# Patient Record
Sex: Female | Born: 1970 | Race: White | Hispanic: No | Marital: Single | Smoking: Current every day smoker
Health system: Southern US, Community
[De-identification: ages and names within clinical notes are randomized; demographics above are authoritative.]

## PROBLEM LIST (undated history)

## (undated) ENCOUNTER — Emergency Department (HOSPITAL_COMMUNITY): Admission: EM | Discharge status: Against Medical Advice | Payer: Self-pay

## (undated) DIAGNOSIS — M5430 Sciatica, unspecified side: Secondary | ICD-10-CM

## (undated) DIAGNOSIS — F419 Anxiety disorder, unspecified: Secondary | ICD-10-CM

## (undated) DIAGNOSIS — F1910 Other psychoactive substance abuse, uncomplicated: Secondary | ICD-10-CM

## (undated) DIAGNOSIS — F319 Bipolar disorder, unspecified: Secondary | ICD-10-CM

## (undated) DIAGNOSIS — F4310 Post-traumatic stress disorder, unspecified: Secondary | ICD-10-CM

## (undated) DIAGNOSIS — Z8739 Personal history of other diseases of the musculoskeletal system and connective tissue: Secondary | ICD-10-CM

## (undated) DIAGNOSIS — F329 Major depressive disorder, single episode, unspecified: Secondary | ICD-10-CM

## (undated) DIAGNOSIS — K219 Gastro-esophageal reflux disease without esophagitis: Secondary | ICD-10-CM

## (undated) DIAGNOSIS — J45909 Unspecified asthma, uncomplicated: Secondary | ICD-10-CM

## (undated) DIAGNOSIS — H269 Unspecified cataract: Secondary | ICD-10-CM

## (undated) DIAGNOSIS — R1319 Other dysphagia: Secondary | ICD-10-CM

## (undated) DIAGNOSIS — K5792 Diverticulitis of intestine, part unspecified, without perforation or abscess without bleeding: Secondary | ICD-10-CM

## (undated) DIAGNOSIS — T7840XA Allergy, unspecified, initial encounter: Secondary | ICD-10-CM

## (undated) DIAGNOSIS — K279 Peptic ulcer, site unspecified, unspecified as acute or chronic, without hemorrhage or perforation: Secondary | ICD-10-CM

## (undated) DIAGNOSIS — M1990 Unspecified osteoarthritis, unspecified site: Secondary | ICD-10-CM

## (undated) DIAGNOSIS — F603 Borderline personality disorder: Secondary | ICD-10-CM

## (undated) HISTORY — DX: Anxiety disorder, unspecified: F41.9

## (undated) HISTORY — DX: Other dysphagia: R13.19

## (undated) HISTORY — DX: Borderline personality disorder: F60.3

## (undated) HISTORY — DX: Unspecified asthma, uncomplicated: J45.909

## (undated) HISTORY — DX: Allergy, unspecified, initial encounter: T78.40XA

## (undated) HISTORY — DX: Peptic ulcer, site unspecified, unspecified as acute or chronic, without hemorrhage or perforation: K27.9

## (undated) HISTORY — PX: OTHER SURGICAL HISTORY: SHX169

## (undated) HISTORY — PX: UPPER GASTROINTESTINAL ENDOSCOPY: SHX188

## (undated) HISTORY — DX: Other psychoactive substance abuse, uncomplicated: F19.10

## (undated) HISTORY — DX: Unspecified osteoarthritis, unspecified site: M19.90

## (undated) HISTORY — DX: Unspecified cataract: H26.9

## (undated) HISTORY — DX: Post-traumatic stress disorder, unspecified: F43.10

## (undated) HISTORY — DX: Gastro-esophageal reflux disease without esophagitis: K21.9

---
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
## 1998-01-14 ENCOUNTER — Ambulatory Visit (HOSPITAL_COMMUNITY): Admission: RE | Admit: 1998-01-14 | Discharge: 1998-01-14 | Payer: Self-pay | Admitting: *Deleted

## 1998-11-16 ENCOUNTER — Other Ambulatory Visit: Admission: RE | Admit: 1998-11-16 | Discharge: 1998-11-16 | Payer: Self-pay | Admitting: Obstetrics and Gynecology

## 1999-10-22 ENCOUNTER — Encounter: Payer: Self-pay | Admitting: *Deleted

## 1999-10-22 ENCOUNTER — Ambulatory Visit (HOSPITAL_COMMUNITY): Admission: RE | Admit: 1999-10-22 | Discharge: 1999-10-22 | Payer: Self-pay | Admitting: *Deleted

## 2002-09-02 ENCOUNTER — Encounter: Admission: RE | Admit: 2002-09-02 | Discharge: 2002-09-02 | Payer: Self-pay | Admitting: Internal Medicine

## 2002-10-08 ENCOUNTER — Emergency Department (HOSPITAL_COMMUNITY): Admission: EM | Admit: 2002-10-08 | Discharge: 2002-10-08 | Payer: Self-pay | Admitting: Emergency Medicine

## 2003-11-11 ENCOUNTER — Emergency Department (HOSPITAL_COMMUNITY): Admission: EM | Admit: 2003-11-11 | Discharge: 2003-11-11 | Payer: Self-pay | Admitting: Emergency Medicine

## 2003-11-12 ENCOUNTER — Emergency Department (HOSPITAL_COMMUNITY): Admission: EM | Admit: 2003-11-12 | Discharge: 2003-11-12 | Payer: Self-pay | Admitting: Emergency Medicine

## 2004-03-10 ENCOUNTER — Emergency Department (HOSPITAL_COMMUNITY): Admission: EM | Admit: 2004-03-10 | Discharge: 2004-03-10 | Payer: Self-pay | Admitting: Emergency Medicine

## 2005-01-22 ENCOUNTER — Emergency Department (HOSPITAL_COMMUNITY): Admission: EM | Admit: 2005-01-22 | Discharge: 2005-01-22 | Payer: Self-pay | Admitting: Emergency Medicine

## 2006-05-30 ENCOUNTER — Emergency Department (HOSPITAL_COMMUNITY): Admission: EM | Admit: 2006-05-30 | Discharge: 2006-05-30 | Payer: Self-pay | Admitting: Emergency Medicine

## 2006-07-29 ENCOUNTER — Emergency Department (HOSPITAL_COMMUNITY): Admission: EM | Admit: 2006-07-29 | Discharge: 2006-07-29 | Payer: Self-pay | Admitting: Emergency Medicine

## 2007-01-31 ENCOUNTER — Emergency Department (HOSPITAL_COMMUNITY): Admission: EM | Admit: 2007-01-31 | Discharge: 2007-01-31 | Payer: Self-pay | Admitting: Emergency Medicine

## 2007-04-10 ENCOUNTER — Emergency Department (HOSPITAL_COMMUNITY): Admission: EM | Admit: 2007-04-10 | Discharge: 2007-04-10 | Payer: Self-pay | Admitting: Emergency Medicine

## 2007-04-26 ENCOUNTER — Emergency Department (HOSPITAL_COMMUNITY): Admission: EM | Admit: 2007-04-26 | Discharge: 2007-04-26 | Payer: Self-pay | Admitting: Emergency Medicine

## 2007-04-30 ENCOUNTER — Emergency Department (HOSPITAL_COMMUNITY): Admission: EM | Admit: 2007-04-30 | Discharge: 2007-04-30 | Payer: Self-pay | Admitting: Emergency Medicine

## 2007-09-25 ENCOUNTER — Emergency Department (HOSPITAL_COMMUNITY): Admission: EM | Admit: 2007-09-25 | Discharge: 2007-09-26 | Payer: Self-pay | Admitting: Emergency Medicine

## 2007-12-03 ENCOUNTER — Emergency Department (HOSPITAL_COMMUNITY): Admission: EM | Admit: 2007-12-03 | Discharge: 2007-12-03 | Payer: Self-pay | Admitting: Emergency Medicine

## 2008-09-02 ENCOUNTER — Ambulatory Visit: Payer: Self-pay | Admitting: Family Medicine

## 2008-09-02 DIAGNOSIS — S335XXA Sprain of ligaments of lumbar spine, initial encounter: Secondary | ICD-10-CM

## 2008-09-02 DIAGNOSIS — F603 Borderline personality disorder: Secondary | ICD-10-CM | POA: Insufficient documentation

## 2008-09-02 DIAGNOSIS — F17200 Nicotine dependence, unspecified, uncomplicated: Secondary | ICD-10-CM | POA: Insufficient documentation

## 2008-09-02 DIAGNOSIS — S339XXA Sprain of unspecified parts of lumbar spine and pelvis, initial encounter: Secondary | ICD-10-CM | POA: Insufficient documentation

## 2008-09-02 DIAGNOSIS — F319 Bipolar disorder, unspecified: Secondary | ICD-10-CM | POA: Insufficient documentation

## 2008-09-02 DIAGNOSIS — F411 Generalized anxiety disorder: Secondary | ICD-10-CM | POA: Insufficient documentation

## 2008-09-10 ENCOUNTER — Encounter: Payer: Self-pay | Admitting: Family Medicine

## 2008-09-15 ENCOUNTER — Encounter: Payer: Self-pay | Admitting: Family Medicine

## 2008-09-16 ENCOUNTER — Encounter: Payer: Self-pay | Admitting: Family Medicine

## 2008-10-01 ENCOUNTER — Ambulatory Visit (HOSPITAL_COMMUNITY): Admission: RE | Admit: 2008-10-01 | Discharge: 2008-10-01 | Payer: Self-pay | Admitting: Family Medicine

## 2008-10-02 ENCOUNTER — Encounter: Payer: Self-pay | Admitting: Family Medicine

## 2008-10-07 ENCOUNTER — Ambulatory Visit: Payer: Self-pay | Admitting: Family Medicine

## 2008-10-21 ENCOUNTER — Telehealth: Payer: Self-pay | Admitting: *Deleted

## 2008-11-18 ENCOUNTER — Ambulatory Visit: Payer: Self-pay | Admitting: Family Medicine

## 2008-11-18 ENCOUNTER — Telehealth: Payer: Self-pay | Admitting: *Deleted

## 2008-11-18 DIAGNOSIS — M5430 Sciatica, unspecified side: Secondary | ICD-10-CM

## 2008-11-18 LAB — CONVERTED CEMR LAB
BUN: 9 mg/dL (ref 6–23)
Calcium: 10.1 mg/dL (ref 8.4–10.5)

## 2008-11-20 ENCOUNTER — Ambulatory Visit (HOSPITAL_COMMUNITY): Admission: RE | Admit: 2008-11-20 | Discharge: 2008-11-20 | Payer: Self-pay | Admitting: Family Medicine

## 2008-11-21 ENCOUNTER — Telehealth: Payer: Self-pay | Admitting: *Deleted

## 2008-11-24 ENCOUNTER — Telehealth: Payer: Self-pay | Admitting: Family Medicine

## 2008-11-26 ENCOUNTER — Encounter: Payer: Self-pay | Admitting: Family Medicine

## 2008-12-23 ENCOUNTER — Ambulatory Visit: Payer: Self-pay | Admitting: Family Medicine

## 2009-01-09 ENCOUNTER — Ambulatory Visit: Payer: Self-pay | Admitting: Family Medicine

## 2009-01-09 ENCOUNTER — Telehealth (INDEPENDENT_AMBULATORY_CARE_PROVIDER_SITE_OTHER): Payer: Self-pay | Admitting: *Deleted

## 2009-01-21 ENCOUNTER — Telehealth: Payer: Self-pay | Admitting: Family Medicine

## 2009-01-30 ENCOUNTER — Telehealth (INDEPENDENT_AMBULATORY_CARE_PROVIDER_SITE_OTHER): Payer: Self-pay | Admitting: *Deleted

## 2009-02-06 ENCOUNTER — Telehealth: Payer: Self-pay | Admitting: Family Medicine

## 2009-02-06 ENCOUNTER — Encounter: Payer: Self-pay | Admitting: Family Medicine

## 2009-02-13 ENCOUNTER — Ambulatory Visit: Payer: Self-pay | Admitting: Family Medicine

## 2009-02-13 DIAGNOSIS — M25519 Pain in unspecified shoulder: Secondary | ICD-10-CM

## 2009-02-13 DIAGNOSIS — R0609 Other forms of dyspnea: Secondary | ICD-10-CM

## 2009-02-13 DIAGNOSIS — R0989 Other specified symptoms and signs involving the circulatory and respiratory systems: Secondary | ICD-10-CM

## 2009-02-13 DIAGNOSIS — K219 Gastro-esophageal reflux disease without esophagitis: Secondary | ICD-10-CM

## 2009-03-13 ENCOUNTER — Encounter: Payer: Self-pay | Admitting: Family Medicine

## 2009-03-13 ENCOUNTER — Ambulatory Visit: Payer: Self-pay | Admitting: Family Medicine

## 2009-03-24 ENCOUNTER — Ambulatory Visit: Payer: Self-pay | Admitting: Family Medicine

## 2009-03-24 ENCOUNTER — Telehealth (INDEPENDENT_AMBULATORY_CARE_PROVIDER_SITE_OTHER): Payer: Self-pay | Admitting: *Deleted

## 2009-03-24 DIAGNOSIS — E669 Obesity, unspecified: Secondary | ICD-10-CM

## 2009-03-31 ENCOUNTER — Encounter: Payer: Self-pay | Admitting: *Deleted

## 2009-04-03 ENCOUNTER — Ambulatory Visit: Payer: Self-pay | Admitting: Family Medicine

## 2009-04-03 DIAGNOSIS — M48061 Spinal stenosis, lumbar region without neurogenic claudication: Secondary | ICD-10-CM | POA: Insufficient documentation

## 2009-04-16 ENCOUNTER — Telehealth: Payer: Self-pay | Admitting: Family Medicine

## 2009-04-16 ENCOUNTER — Ambulatory Visit: Payer: Self-pay | Admitting: Family Medicine

## 2009-04-20 ENCOUNTER — Telehealth: Payer: Self-pay | Admitting: Family Medicine

## 2009-05-12 ENCOUNTER — Encounter: Payer: Self-pay | Admitting: Psychology

## 2009-05-12 ENCOUNTER — Ambulatory Visit: Payer: Self-pay | Admitting: Family Medicine

## 2009-05-13 ENCOUNTER — Telehealth: Payer: Self-pay | Admitting: Family Medicine

## 2009-05-14 ENCOUNTER — Ambulatory Visit (HOSPITAL_COMMUNITY): Admission: RE | Admit: 2009-05-14 | Discharge: 2009-05-14 | Payer: Self-pay | Admitting: Family Medicine

## 2009-05-14 ENCOUNTER — Telehealth: Payer: Self-pay | Admitting: Family Medicine

## 2009-05-14 ENCOUNTER — Encounter: Payer: Self-pay | Admitting: Family Medicine

## 2009-05-18 ENCOUNTER — Telehealth: Payer: Self-pay | Admitting: Family Medicine

## 2009-05-18 DIAGNOSIS — M5137 Other intervertebral disc degeneration, lumbosacral region: Secondary | ICD-10-CM

## 2009-05-26 ENCOUNTER — Telehealth: Payer: Self-pay | Admitting: *Deleted

## 2009-06-05 ENCOUNTER — Ambulatory Visit: Payer: Self-pay | Admitting: Family Medicine

## 2009-06-09 ENCOUNTER — Encounter: Admission: RE | Admit: 2009-06-09 | Discharge: 2009-07-14 | Payer: Self-pay | Admitting: Family Medicine

## 2009-06-09 ENCOUNTER — Encounter: Payer: Self-pay | Admitting: Family Medicine

## 2009-08-13 ENCOUNTER — Ambulatory Visit: Payer: Self-pay | Admitting: Family Medicine

## 2009-08-13 DIAGNOSIS — S93409A Sprain of unspecified ligament of unspecified ankle, initial encounter: Secondary | ICD-10-CM | POA: Insufficient documentation

## 2009-08-21 ENCOUNTER — Telehealth: Payer: Self-pay | Admitting: Family Medicine

## 2009-08-21 ENCOUNTER — Ambulatory Visit: Payer: Self-pay | Admitting: Family Medicine

## 2009-10-05 ENCOUNTER — Telehealth: Payer: Self-pay | Admitting: Family Medicine

## 2009-10-16 ENCOUNTER — Telehealth: Payer: Self-pay | Admitting: Family Medicine

## 2009-10-20 ENCOUNTER — Telehealth: Payer: Self-pay | Admitting: *Deleted

## 2009-10-20 ENCOUNTER — Emergency Department (HOSPITAL_COMMUNITY): Admission: EM | Admit: 2009-10-20 | Discharge: 2009-10-20 | Payer: Self-pay | Admitting: Family Medicine

## 2009-10-27 ENCOUNTER — Emergency Department (HOSPITAL_COMMUNITY): Admission: EM | Admit: 2009-10-27 | Discharge: 2009-10-28 | Payer: Self-pay | Admitting: Emergency Medicine

## 2009-10-30 ENCOUNTER — Ambulatory Visit: Payer: Self-pay | Admitting: Family Medicine

## 2009-10-30 DIAGNOSIS — M549 Dorsalgia, unspecified: Secondary | ICD-10-CM | POA: Insufficient documentation

## 2009-11-12 ENCOUNTER — Telehealth: Payer: Self-pay | Admitting: Family Medicine

## 2009-11-13 ENCOUNTER — Ambulatory Visit: Payer: Self-pay | Admitting: Family Medicine

## 2009-11-13 DIAGNOSIS — J029 Acute pharyngitis, unspecified: Secondary | ICD-10-CM | POA: Insufficient documentation

## 2009-11-13 LAB — CONVERTED CEMR LAB: Rapid Strep: NEGATIVE

## 2009-11-14 ENCOUNTER — Encounter: Payer: Self-pay | Admitting: Family Medicine

## 2009-11-19 ENCOUNTER — Emergency Department (HOSPITAL_COMMUNITY): Admission: EM | Admit: 2009-11-19 | Discharge: 2009-11-19 | Payer: Self-pay | Admitting: Family Medicine

## 2009-12-01 ENCOUNTER — Telehealth: Payer: Self-pay | Admitting: Family Medicine

## 2009-12-01 ENCOUNTER — Emergency Department (HOSPITAL_COMMUNITY): Admission: EM | Admit: 2009-12-01 | Discharge: 2009-12-01 | Payer: Self-pay | Admitting: Family Medicine

## 2009-12-01 ENCOUNTER — Encounter: Payer: Self-pay | Admitting: Family Medicine

## 2009-12-10 ENCOUNTER — Telehealth: Payer: Self-pay | Admitting: Family Medicine

## 2009-12-11 ENCOUNTER — Ambulatory Visit: Payer: Self-pay | Admitting: Family Medicine

## 2009-12-11 DIAGNOSIS — M7020 Olecranon bursitis, unspecified elbow: Secondary | ICD-10-CM

## 2009-12-11 DIAGNOSIS — M25529 Pain in unspecified elbow: Secondary | ICD-10-CM

## 2009-12-14 ENCOUNTER — Telehealth: Payer: Self-pay | Admitting: *Deleted

## 2009-12-15 ENCOUNTER — Ambulatory Visit (HOSPITAL_COMMUNITY): Admission: RE | Admit: 2009-12-15 | Discharge: 2009-12-15 | Payer: Self-pay | Admitting: Family Medicine

## 2009-12-16 ENCOUNTER — Telehealth: Payer: Self-pay | Admitting: Family Medicine

## 2009-12-21 ENCOUNTER — Telehealth: Payer: Self-pay | Admitting: Family Medicine

## 2009-12-23 ENCOUNTER — Telehealth: Payer: Self-pay | Admitting: Family Medicine

## 2009-12-29 ENCOUNTER — Ambulatory Visit: Payer: Self-pay | Admitting: Family Medicine

## 2009-12-29 ENCOUNTER — Telehealth: Payer: Self-pay | Admitting: *Deleted

## 2009-12-30 ENCOUNTER — Telehealth: Payer: Self-pay | Admitting: Family Medicine

## 2009-12-31 ENCOUNTER — Telehealth: Payer: Self-pay | Admitting: Family Medicine

## 2010-01-12 ENCOUNTER — Ambulatory Visit: Payer: Self-pay | Admitting: Family Medicine

## 2010-01-13 ENCOUNTER — Telehealth: Payer: Self-pay | Admitting: Family Medicine

## 2010-01-20 ENCOUNTER — Telehealth: Payer: Self-pay | Admitting: Family Medicine

## 2010-01-21 ENCOUNTER — Telehealth: Payer: Self-pay | Admitting: *Deleted

## 2010-02-03 ENCOUNTER — Telehealth: Payer: Self-pay | Admitting: *Deleted

## 2010-02-06 ENCOUNTER — Emergency Department (HOSPITAL_COMMUNITY): Admission: EM | Admit: 2010-02-06 | Discharge: 2010-02-07 | Payer: Self-pay | Admitting: Emergency Medicine

## 2010-02-07 ENCOUNTER — Ambulatory Visit: Payer: Self-pay | Admitting: Psychiatry

## 2010-02-08 ENCOUNTER — Inpatient Hospital Stay (HOSPITAL_COMMUNITY): Admission: RE | Admit: 2010-02-08 | Discharge: 2010-02-08 | Payer: Self-pay | Admitting: Psychiatry

## 2010-02-25 ENCOUNTER — Emergency Department (HOSPITAL_COMMUNITY): Admission: EM | Admit: 2010-02-25 | Discharge: 2010-02-26 | Payer: Self-pay | Admitting: Emergency Medicine

## 2010-02-26 ENCOUNTER — Inpatient Hospital Stay (HOSPITAL_COMMUNITY): Admission: AD | Admit: 2010-02-26 | Discharge: 2010-03-12 | Payer: Self-pay | Admitting: Psychiatry

## 2010-04-20 ENCOUNTER — Ambulatory Visit: Payer: Self-pay | Admitting: Family Medicine

## 2010-04-20 DIAGNOSIS — N760 Acute vaginitis: Secondary | ICD-10-CM | POA: Insufficient documentation

## 2010-04-20 DIAGNOSIS — K029 Dental caries, unspecified: Secondary | ICD-10-CM | POA: Insufficient documentation

## 2010-04-20 LAB — CONVERTED CEMR LAB
ALT: 18 units/L (ref 0–35)
AST: 18 units/L (ref 0–37)
BUN: 14 mg/dL (ref 6–23)
CO2: 28 meq/L (ref 19–32)
Calcium: 9.6 mg/dL (ref 8.4–10.5)
Creatinine, Ser: 0.96 mg/dL (ref 0.40–1.20)
Direct LDL: 110 mg/dL — ABNORMAL HIGH
GC Probe Amp, Genital: NEGATIVE
Glucose, Bld: 89 mg/dL (ref 70–99)
Sodium: 137 meq/L (ref 135–145)

## 2010-04-26 ENCOUNTER — Encounter: Payer: Self-pay | Admitting: Family Medicine

## 2010-08-03 ENCOUNTER — Encounter: Payer: Self-pay | Admitting: Family Medicine

## 2010-08-31 NOTE — Miscellaneous (Signed)
Summary: request injection  Clinical Lists Changes Pt called complaining of back pain requesting apt today to get injection, taking motrin with some relief.   Unknown last injection date. To PCP.Marland KitchenGladstone Pih  August 13, 2009 9:24 AM  just received phone call that Pt is on her way to the clinic.Marland KitchenGladstone Pih  August 13, 2009 10:19 AM  walked in tremidol and tylenol not helping, also injuried left leg yesterday, put in work in sched.Gladstone Pih  August 13, 2009 10:34 AM

## 2010-08-31 NOTE — Progress Notes (Signed)
Summary: triage  Phone Note Call from Patient Call back at [phone]   Caller: Patient Summary of Call: Pt when she urinates it hurts and urine is cloudy. Initial call taken by: Clydell Hakim,  October 16, 2009 4:09 PM  Follow-up for Phone Call        started yesterday. burning with urination. urine is cloudy. unable to see her thi slate in clinic. sent to UC. she agreed. told her to go now. do not wait for the weekend Follow-up by: Golden Circle RN,  October 16, 2009 4:16 PM  Additional Follow-up for Phone Call Additional follow up Details #1::        she called asking for an appt today for a sore throat x 2 weeks. she did not go to UC for the UTI symptoms due to lack of a ride. to come in before 3pm today Additional Follow-up by: Golden Circle RN,  October 19, 2009 2:06 PM

## 2010-08-31 NOTE — Progress Notes (Signed)
Summary: meds prob  Phone Note Refill Request Call back at Home Phone [phone] Message from:  Patient  Refills Requested: Medication #1:  [facility] OMEPRAZOLE 20 MG TBEC 1 tab by mouth once daily DISP GENERIC EQUIVALENT OFFERED BY HEALTH DEPARTMENT   Notes: Health Dept doesn't have this med and doesn't know when/if they will get it. [facility] [address]- needs it to be under $10   Initial call taken by: De Nurse,  January 21, 2010 3:15 PM  Follow-up for Phone Call        plz order something else Follow-up by: Golden Circle RN,  January 21, 2010 3:18 PM  Additional Follow-up for Phone Call Additional follow up Details #1::        LM that rx is at health dept. plz call if that is not the right pharmacy Additional Follow-up by: Golden Circle RN,  January 22, 2010 1:52 PM    New/Updated Medications: AMITRIPTYLINE HCL 50 MG TABS (AMITRIPTYLINE HCL) 1 by mouth at bedtime Prescriptions: AMITRIPTYLINE HCL 50 MG TABS (AMITRIPTYLINE HCL) 1 by mouth at bedtime  #30 x 1   Entered and Authorized by:   Paula Compton MD   Signed by:   Paula Compton MD on 01/22/2010   Method used:   Faxed to ...       [facility] (retail)       [address]       [place], [place]  [address]       Ph: [phone]       Fax: [phone]   RxID:   [id number]  There will not be a PPI for under $10 that I know of.  Omeprazole 20mg  is OTC. Paula Compton MD  January 21, 2010 4:20 PM  infomred pt of above.states she has become immune to the flexeril & it no longer works. wants the elavil back. to pcp.Golden Circle RN  January 22, 2010 9:05 AM  Sent a script for amitriptyline Caleb Popp) to the [facility].  Please notify patient.  If she wishes to get from [facility], it may be called in.  Paula Compton MD  January 22, 2010 12:06 PM

## 2010-08-31 NOTE — Progress Notes (Signed)
  Phone Note Call from Patient   Caller: Patient Summary of Call: Call received from [place], who says she is "on the edge", feels depressed.  She reports she tried to call [facility] [place] Victorino Dike) about enhanced services but was unable to get through.  She contracts for safety with me, stating that she does not intend to do harm to herself.  Says that she does not have a Child psychotherapist or case Production designer, theatre/television/film since being released from prison; used to have an [facility]HA named Zarephath, but she has not seen in [place], unsure if still available to her.  She knows to call 9-1-1 if she feels in danger of harming herself.  I told her I would place a call to Lenetta Quaker of [facility] [place] on [place] behalf, to notify that Camela is trying to get plugged in.  I called and left a message with Victorino Dike to this effect, which included Channa's telephone number.  Initial call taken by: Paula Compton MD,  December 30, 2009 4:26 PM

## 2010-08-31 NOTE — Letter (Signed)
Summary: Generic Letter  [facility]  [address]   [place], [place] [address]   Phone: [phone]  Fax: [phone]    04/26/2010  Vanessa Wood [address] [place], [place]  [address]  Dear Dois Davenport,   I hope this letter finds you well.  I have received the result of your recent PAP smear and the result is normal.  Given your prior history of abnormal PAP smear, I recommend a repeat PAP smear in 1 year.   Please let me know if you have any questions or concerns.      Sincerely,   Paula Compton MD  Appended Document: Generic Letter mailed.

## 2010-08-31 NOTE — Progress Notes (Signed)
Summary: triage  Phone Note Call from Patient Call back at Home Phone [phone]   Caller: Patient Summary of Call: Pt seen last week for pain in her back and was given a inj, but pain is still no better.   Initial call taken by: Clydell Hakim,  August 21, 2009 2:02 PM  Follow-up for Phone Call        Hip injection on the 13th, hurts when walking or any movement, same painful if not more so than when she had injection, statd she moved wrong and it has been hurting ever since.  Apt this PM with PCP, aware of wait. Follow-up by: Golden Circle RN,  August 21, 2009 2:08 PM

## 2010-08-31 NOTE — Assessment & Plan Note (Signed)
Summary: r elbow pain/[place]   Vital Signs:  Patient profile:   40 year old female Height:      64 inches Weight:      292.5 pounds BMI:     50.39 Pulse rate:   90 / minute BP sitting:   126 / 86  (left arm) Cuff size:   large  Vitals Entered By: Arlyss Repress CMA, (Dec 11, 2009 9:15 AM) CC: right elbow pain to touch. no pain with movements. has been seen at [facility], but did not fill RX unable to afford. Is Patient Diabetic? No Pain Assessment Patient in pain? no        Primary Care Provider:  Paula Compton MD  CC:  right elbow pain to touch. no pain with movements. has been seen at [facility] and but did not fill RX unable to afford..  History of Present Illness: Vanessa Wood comes in today for followup on an injury to her R elbow, which occurred before Easter (April 24) when she slipped on uneven stairs at her home.  She scraped her R shin and hit her R elbow on the ground. Since then it has been painful to touch but not to move.  She has full active ROM of both elbows, but has pain on palpation of the olecranon on the right.   Is "about to explode" from stress; says she was associating with a woman who was arrested for shoplifting and was found to have stolen pills in her possession; Vanessa Wood has charges against her for her association with this person.  She is at odds with her daughters, and this has her stressed out.  Is crying during the interview.   Habits & Providers  Alcohol-Tobacco-Diet     Tobacco Status: current     Tobacco Counseling: to quit use of tobacco products     Cigarette Packs/Day: 0.5  Allergies: No Known Drug Allergies  Physical Exam  General:  Alert, pleasant. Emotionally labile with certain topics, but maintains composure.  Msk:  R elbow without warmth or redness.  Full active ROM both elbows.   Palpation over the olecranon reveals tenderness.  Sizeable ballotable effusion over olecranon bursa on the R>   R shin with healed abrasion.   Additional Exam:  Procedure Note:  After counseling on risks and benefits, and getting written consent and answering questions, patient was prepped for olecranon bursa aspiration on the R elbow.  Prepped in sterile fashion; 1% lidocaine without epi used to raise a wheal at site of injection.  A 23-gauge needle was used to aspirate 10cc of serosanguinous fluid from the olecranon bursa.  Tolerated well. EBL zero.  Patient displayed continued full active ROM R elbow after procedure.  Wrapped with ace bandage.  Given followup instructions and red flags about when to follow up and what are signs of need for rapid follow-up (warmth, redness, pain).   Impression & Recommendations:  Problem # 1:  ELBOW PAIN, RIGHT (ICD-719.42) Aspirated 10cc serosanguinous fluid from R olecranon bursa.  Nothing to suggest infectious bursitis. Discussed reasons for immediate follow-up .   Orders: Diagnostic X-Ray/Fluoroscopy (Diagnostic X-Ray/Flu)  Problem # 2:  OLECRANON BURSITIS, RIGHT (ICD-726.33)  See above, and procedure note.   Orders: [facility]- Est Level  3 ([address]) Injection, small joint- [facility] ([address])  Complete Medication List: 1)  Zoloft 100 Mg Tabs (Sertraline hcl) .... Sig: take 2 tablets in the morning 2)  Wellbutrin Sr 150 Mg Xr12h-tab (Bupropion hcl) .... Sig: take 1 tablet by mouth in the  morning 3)  [facility] Omeprazole 20 Mg Tbec (Omeprazole) .Marland Kitchen.. 1 tab by mouth once daily disp generic equivalent offered by health department 4)  Proventil Hfa 108 (90 Base) Mcg/act Aers (Albuterol sulfate) .... Sig: use 2 puffs every 4 hours as needed for shortness of breath.  disp 1 hfa, of the generic/[facility] brand that is cheapest 5)  Clonazepam 0.5 Mg Tabs (Clonazepam) .... One three times a day as needed 6)  Seroquel 400 Mg Tabs (Quetiapine fumarate) .... One at bedtime 7)  Gabapentin 300 Mg Caps (Gabapentin) .... One four times a day (1200mg  total daily dose) 8)  Buspirone Hcl 10 Mg Tabs (Buspirone hcl) .Marland Kitchen.. 1 tab by mouth two times a day 9)   Cyclobenzaprine Hcl 10 Mg Tabs (Cyclobenzaprine hcl) .... Sig: take 1 tab by mouth every 8 hours as needed for muscular pain 10)  Cyclobenzaprine Hcl 10 Mg Tabs (Cyclobenzaprine hcl) .... Sig: take 1 tab by mouth every 12 hours for up to two weeks. 11)  Cetirizine Hcl 10 Mg Tabs (Cetirizine hcl) .Marland Kitchen.. 1 tab by mouth daily (can be bought over the counter) 12)  Fluticasone Propionate 50 Mcg/act Susp (Fluticasone propionate) .Marland Kitchen.. 1 spray each nose once daily for allergies  Patient Instructions: 1)  It was a pleasure to see you today.  You have a condition called olecranon bursitis on your Right elbow.  Today I drew some fluid off the elbow.  I do not think there is a fracture, but I would like to send you for an x-ray to make sure. 2)  I recommend you wrap the right elbow in an ACE wrap to prevent re-accumulation of the fluid in the elbow.  You may continue to use the Advil as you have been doing.

## 2010-08-31 NOTE — Progress Notes (Signed)
Summary: meds prob  Phone Note Call from Patient Call back at Home Phone [phone]   Caller: Patient Summary of Call: pt states that her Albuterol is not on the $4 plan anymore and needs something comparable. Flexeril is not working anymore and needs something stronger. [facility] Initial call taken by: De Nurse,  Dec 21, 2009 10:47 AM  Follow-up for Phone Call        to pcp. do you need to see her? Follow-up by: Golden Circle RN,  Dec 21, 2009 10:53 AM  Additional Follow-up for Phone Call Additional follow up Details #1::        spoke with her mom & asked her to have dtr call me back when she gets in Additional Follow-up by: Golden Circle RN,  Dec 21, 2009 3:54 PM    Additional Follow-up for Phone Call Additional follow up Details #2::    Pt returning Sally's call. Follow-up by: Clydell Hakim,  Dec 21, 2009 11:53 AM  Additional Follow-up for Phone Call Additional follow up Details #3:: Details for Additional Follow-up Action Taken: line busy Additional Follow-up by: Golden Circle RN,  Dec 21, 2009 12:27 PM  I would be interested to know if she has registered with MAP program for albuterol through them.  Does not need to see me for this.  I would like to see her before considering changes from the Flexeril. Paula Compton MD  Dec 21, 2009 11:00 AM   called & LM for her to call. the woman who picked up said she was in the shower.Golden Circle RN  Dec 21, 2009 11:20 AM   she states they do not make the brand of albuterol that is rx'd. needs to change to something else for it to be $4.  appt made for next Tues to see pcp. states she has Jaynee Eagles but the copay is $5. states she has no money.says she is filing for disability.Marland KitchenMarland KitchenGolden Circle RN  Dec 21, 2009 4:01 PM  There is not a form of albuterol or other med in this class that is available for $4 anymore.  Worsening muscle pain that requires change in medical therapy requires an office visit.  Paula Compton MD   Dec 21, 2009 7:37 PM

## 2010-08-31 NOTE — Progress Notes (Signed)
Summary: arm sling/TS  Phone Note Outgoing Call   Call placed by: Paula Compton MD,  Dec 14, 2009 1:36 PM Call placed to: Patient Summary of Call: Called patient to follow up on her R elbow; she states it continues to be sore and red.  She has been unable to get the x-ray done, will make an attempt.  I instructed her to use sling for the R elbow.  Will call her back to see if that is something we can provide her from our office.  If not, she may acquire one at pharmacy/med supply store. Initial call taken by: Paula Compton MD,  Dec 14, 2009 1:37 PM  Follow-up for Phone Call        called pt. arm sling up front for pick up. pt will have x-ray done tomorrow. Follow-up by: Arlyss Repress CMA,,  Dec 14, 2009 2:15 PM

## 2010-08-31 NOTE — Progress Notes (Signed)
Summary: phn msg  Phone Note Call from Patient Call back at [phone]   Caller: Patient Summary of Call: went for hearing and doesn't think she'll get her disability but she is OK.  if doctor wants to talk to her, pls call Initial call taken by: De Nurse,  January 13, 2010 9:40 AM     I called Vanessa Wood, she is unsure whether she will be given disability.  Says she is fine right now.  Agrees to contact our office if she worsens. Paula Compton MD  January 13, 2010 2:39 PM

## 2010-08-31 NOTE — Progress Notes (Signed)
Summary: triage  Phone Note Call from Patient Call back at Home Phone [phone]   Caller: Patient Summary of Call: Pt wondering if Dr. Mauricio Po was able to call her in something for her breathing?  Inhaler that she is out of is Albuterol and they do not make that anymore.  needs something else called into [facility] Initial call taken by: Clydell Hakim,  Dec 23, 2009 8:39 AM  Follow-up for Phone Call        told her that I will send to pcp. they may have a generic available. she has [employer] so can use HD Follow-up by: Golden Circle RN,  Dec 23, 2009 8:47 AM  Additional Follow-up for Phone Call Additional follow up Details #1::        called it to HD Additional Follow-up by: Golden Circle RN,  Dec 23, 2009 9:03 AM    Prescriptions: PROVENTIL HFA 108 (90 BASE) MCG/ACT AERS (ALBUTEROL SULFATE) SIG: Use 2 puffs every 4 hours as needed for shortness of breath.  DISP 1 HFA, of the generic/[facility] brand that is cheapest  #8 Gram x 5   Entered and Authorized by:   Paula Compton MD   Signed by:   Paula Compton MD on 12/23/2009   Method used:   Faxed to ...       [facility] (retail)       [address] [place], [place]  [address]       Ph: [phone]       Fax: [phone]   RxID:   [id number]  Albuterol HFA should be available in Health Dept.  New prescription faxed.  Paula Compton MD  Dec 23, 2009 8:58 AM

## 2010-08-31 NOTE — Assessment & Plan Note (Signed)
Summary: refill meds,df/resch'd from 9/2 for cpe & meds refill/[facility]c   Vital Signs:  Patient profile:   40 year old female Height:      64 inches Weight:      297 pounds BMI:     51.16 Temp:     98.0 degrees F oral Pulse rate:   89 / minute BP sitting:   110 / 73  (left arm)  Vitals Entered By: Tessie Fass CMA (April 20, 2010 10:35 AM) CC: complete physical with pap Is Patient Diabetic? No Pain Assessment Patient in pain? no        Primary Care Provider:  Paula Compton MD  CC:  complete physical with pap.  History of Present Illness: Vanessa Wood is in today for followup, and PAP smear.  She was hospitalized for attempted suicide (overdose) July 27th, then to inpatient Behavioral Health for 17 days, then to [facility] where she has been for the past 37 days. In rehab for alcohol and THC.  She reports that she is doing much better now.  No longer living with her mother (living at [facility]) and feels much more positive.  Her daughters are doing better than they were before.  She was turned down for disability, and she wants to do something positive with her life.    Reviewed and updated her meds, which are written for her by [facility] who is following her.    She denies any SI at this point.  Feels that she's "getting it right".   Would like a referral for a dentist appt with [facility].   Last PAP 2009; had irregular PAP in her early 85s.   LMP late August, is due to start any day now.  Somewhat irregular.   Continues to smoke about 1/2 ppd.  At some point interested in quitting.   Habits & Providers  Alcohol-Tobacco-Diet     Tobacco Status: current     Tobacco Counseling: to quit use of tobacco products     Cigarette Packs/Day: 0.5  Allergies: No Known Drug Allergies  Family History: Mother with detached retina. Father committed suicide.  Sept 20, 2011: No family history of breast cancer.  Maternal grandmother with unspecified cancer history.     Physical Exam  General:  Alert, smiling, appears much more calm that at last visit.  No apparent distress.  Eyes:  clear sclerae Mouth:  moist mucus membranes.  Poor dentition.  Neck:  neck supple.  Breasts:  no breast masses or skin changes, no nipple discharge. No axillary nodules.  Lungs:  Normal respiratory effort, chest expands symmetrically. Lungs are clear to auscultation, no crackles or wheezes. Heart:  Normal rate and regular rhythm. S1 and S2 normal without gallop, murmur, click, rub or other extra sounds. Genitalia:  Normal introitus for age, no external lesions, no vaginal discharge, mucosa pink and moist, no vaginal or cervical lesions, no vaginal atrophy, no friaility or hemorrhage, normal uterus size and position, no adnexal masses or tenderness.  No cervical discharge.   Impression & Recommendations:  Problem # 1:  Preventive Health Care (ICD-V70.0) Patient for LDL check in light of obesity and tobacco as risk factors for heart disease. Ate breakfast today, so will check direct LDL.  Discussed smoking cessation; Bryce is working through a lot of issues now and will revisit this issue at a later date.   Problem # 2:  BIPOLAR DISORDER UNSPECIFIED (ICD-296.80) Meds prescribed by [facility].  Doing well in rehab center.  Has positive outlook, feels better overall.   Problem # 3:  POTENTIAL CARIES (ICD-521.00) Referral for [facility] dental clinic to address dental issues.  Orders: Dental Referral (Dentist) [facility]- Est  Level 4 ([address])  Problem # 4:  TOBACCO ABUSE (ICD-305.1) Discussed smoking cessation today.   Complete Medication List: 1)  Zoloft 100 Mg Tabs (Sertraline hcl) .... Sig: take 2 tablets in the morning 2)  Cvs Omeprazole 20 Mg Tbec (Omeprazole) .Marland Kitchen.. 1 tab by mouth once daily disp generic equivalent offered by health department 3)  Proventil Hfa 108 (90 Base) Mcg/act Aers (Albuterol sulfate) .... Sig: use 2 puffs every 4 hours as needed for  shortness of breath.  disp 1 hfa, of the generic/[facility]mart brand that is cheapest 4)  Seroquel 400 Mg Tabs (Quetiapine fumarate) .... Two at bedtime 5)  Cetirizine Hcl 10 Mg Tabs (Cetirizine hcl) .Marland Kitchen.. 1 tab by mouth daily (can be bought over the counter) 6)  Seroquel 200 Mg Tabs (Quetiapine fumarate) .... Take1 tab along with an additional 50mg  tab (total 250mg /day) 7)  Seroquel 50 Mg Tabs (Quetiapine fumarate) .... Take 1 tab along with 200mg  (total 250mg /day) 8)  Risperdal 1 Mg Tabs (Risperidone) .... Take 1 qhs 9)  Trazodone Hcl 100 Mg Tabs (Trazodone hcl) .... Qd 10)  Buspirone Hcl 15 Mg Tabs (Buspirone hcl) .... Take 2 tabs in morning, 1 tab noon and 1 tab evening (total 4 tabs/daily) 11)  Wellbutrin Sr 150 Mg Xr12h-tab (Bupropion hcl) .... Take 1 tab by mouth two times a day  Other Orders: GC/Chlamydia-[facility] (87591/87491) Pap Smear-[facility] (98119-14782) Comp Met-[facility] [phone]) Direct LDL-[facility] (78469-62952) HIV-[facility] (84132-44010)  Patient Instructions: 1)  It was good to see you today.   2)  I put in a referral for the [facility].  3)  I would like to see you again in 2 months, or sooner if needed.  4)  Please be sure we have your updated contact information (phone number) at the front desk. Prescriptions: BUSPIRONE HCL 15 MG TABS (BUSPIRONE HCL) Take 2 tabs in morning, 1 tab noon and 1 tab evening (total 4 tabs/daily)  #120 x 3   Entered and Authorized by:   Paula Compton MD   Signed by:   Paula Compton MD on 04/20/2010   Method used:   Print then Give to Patient   RxID:   [id number]

## 2010-08-31 NOTE — Assessment & Plan Note (Signed)
Summary: Hip pain/lgk   Vital Signs:  Patient profile:   40 year old female Weight:      290 pounds Pulse rate:   100 / minute BP sitting:   138 / 96  (right arm)  Vitals Entered By: Arlyss Repress CMA, (August 21, 2009 2:44 PM) CC: f/up right hip pain. worse with movements. request meds on the $4 list Is Patient Diabetic? No Pain Assessment Patient in pain? yes     Location: right hip Intensity: 10   Primary Care Provider:  Paula Compton MD  CC:  f/up right hip pain. worse with movements. request meds on the $4 list.  History of Present Illness: Vanessa Wood comes in today for complaint of worsening pain in R buttock, running down the back of R leg. Worse when she goes from sitting to standing position, mildly better after she's been walking a bit.  No acute trauma.  She feels that it started when mopping her floor, felt the buttocks area "pop" and sharp radiating pain.  She denies bowel or bladder incontinence or saddle anesthesia.  Denies weakness in the legs or feet, however pain limits her mobility some.   Long history of lower back pain, this pain is different to her.   Of note, some med doses from [facility] have changed.  Increased dose Seroquel, has her with more dry mouth.  Also feels that she is gaining weight from the medication.     Habits & Providers  Alcohol-Tobacco-Diet     Tobacco Status: current     Tobacco Counseling: to quit use of tobacco products  Current Medications (verified): 1)  Zoloft 100 Mg Tabs (Sertraline Hcl) .... Sig: Take 2 Tablets in The Morning 2)  Wellbutrin Sr 150 Mg Xr12h-Tab (Bupropion Hcl) .... Sig: Take 1 Tablet By Mouth in The Morning 3)  Cvs Omeprazole 20 Mg Tbec (Omeprazole) .Marland Kitchen.. 1 Tab By Mouth Once Daily Disp Generic Equivalent Offered By Health Department 4)  Proventil Hfa 108 (90 Base) Mcg/act Aers (Albuterol Sulfate) .... Sig: Use 2 Puffs Every 4 Hours As Needed For Shortness of Breath.  Disp 1 Hfa, of The Generic/walmart Brand  That Is Cheapest 5)  Clonazepam 0.5 Mg Tabs (Clonazepam) .... One Three Times A Day As Needed 6)  Seroquel 400 Mg Tabs (Quetiapine Fumarate) .... One At Bedtime 7)  Gabapentin 300 Mg Caps (Gabapentin) .... One Four Times A Day (1200mg  Total Daily Dose) 8)  Buspirone Hcl 10 Mg Tabs (Buspirone Hcl) .Marland Kitchen.. 1 Tab By Mouth Two Times A Day 9)  Amitriptyline Hcl 50 Mg Tabs (Amitriptyline Hcl) .... Sig: Take One Tablet, Along With The 100mg  Tablet, Nightly For A Total of 150mg /day 10)  Amitriptyline Hcl 100 Mg Tabs (Amitriptyline Hcl) .Marland Kitchen.. 1 By Mouth At Bedtime With The 50mg  Tablet For Total Dose 150mg /day 11)  Cyclobenzaprine Hcl 10 Mg Tabs (Cyclobenzaprine Hcl) .... Sig: Take 1 Tab By Mouth Every 8 Hours As Needed For Muscular Pain 12)  Prednisone 20 Mg Tabs (Prednisone) .... Sig: Take 2 Tablets By Mouth One Time Daily For 10 Days  Allergies (verified): No Known Drug Allergies  Physical Exam  Pulses:  Palpable dp pulses bilaterally. Extremities:  Pain to palpate sciatic notch, also with sitting SLR test.  No pain to abd/adduct hip or int/ext rotate hip actively.  No pedal edema.  Neurologic:  Able to walk without assistance.  L4/5 and S1 intact.  Dorsi/plantar flexion of both feet intact and grossly symmetric.    Impression & Recommendations:  Problem # 1:  SCIATICA (ICD-724.3)  Patient with apparent sciatica by physical exam.  No neurologic red flags.    Trial of oral prednisone for 10 days, combined with trial muscle relaxant as she requests.  I have expressed my concern regarding the sedative nature of the cyclobenzaprine along with her other medications.  She is aware of this concern and will use caution in the timing of her meds.   For follow up in the coming 2 weeks, or sooner if needed.  Red flags and reasons for ER evaluation were discussed.  Her updated medication list for this problem includes:    Cyclobenzaprine Hcl 10 Mg Tabs (Cyclobenzaprine hcl) ..... Sig: take 1 tab by mouth  every 8 hours as needed for muscular pain  Orders: [facility]- Est Level  3 ([address])  Problem # 2:  OBESITY, UNSPECIFIED (ICD-278.00) Gained about 10 lbs since Oct 2010.  Likely secondary to med changes, coupled with depression and poor mobility.  Orders: [facility]- Est Level  3 ([address]454)  Complete Medication List: 1)  Zoloft 100 Mg Tabs (Sertraline hcl) .... Sig: take 2 tablets in the morning 2)  Wellbutrin Sr 150 Mg Xr12h-tab (Bupropion hcl) .... Sig: take 1 tablet by mouth in the morning 3)  Cvs Omeprazole 20 Mg Tbec (Omeprazole) .Marland Kitchen.. 1 tab by mouth once daily disp generic equivalent offered by health department 4)  Proventil Hfa 108 (90 Base) Mcg/act Aers (Albuterol sulfate) .... Sig: use 2 puffs every 4 hours as needed for shortness of breath.  disp 1 hfa, of the generic/walmart brand that is cheapest 5)  Clonazepam 0.5 Mg Tabs (Clonazepam) .... One three times a day as needed 6)  Seroquel 400 Mg Tabs (Quetiapine fumarate) .... One at bedtime 7)  Gabapentin 300 Mg Caps (Gabapentin) .... One four times a day (1200mg  total daily dose) 8)  Buspirone Hcl 10 Mg Tabs (Buspirone hcl) .Marland Kitchen.. 1 tab by mouth two times a day 9)  Amitriptyline Hcl 50 Mg Tabs (Amitriptyline hcl) .... Sig: take one tablet, along with the 100mg  tablet, nightly for a total of 150mg /day 10)  Amitriptyline Hcl 100 Mg Tabs (Amitriptyline hcl) .Marland Kitchen.. 1 by mouth at bedtime with the 50mg  tablet for total dose 150mg /day 11)  Cyclobenzaprine Hcl 10 Mg Tabs (Cyclobenzaprine hcl) .... Sig: take 1 tab by mouth every 8 hours as needed for muscular pain 12)  Prednisone 20 Mg Tabs (Prednisone) .... Sig: take 2 tablets by mouth one time daily for 10 days  Patient Instructions: 1)  It was a pleasure to see you today, Vanessa Wood.  2)  I am prescribing two new medicines for your leg pain, which I believe is sciatica: 3)  Prednisone 20mg  tablets, take 2 tablets (total of 40mg ) one time daily for 10 days.  It is not good to be on this medicine for an extended  period of time. 4)  Flexeril (cyclobenzaprine) 10mg , take one tablet every 8 hours as needed for muscular pain.  This medicine may cause drowsiness, please be careful with your other medicines that are sedating. 5)  I would like to see you back in the office on the week of January 31st for a follow up appointment. Prescriptions: PREDNISONE 20 MG TABS (PREDNISONE) SIG: Take 2 tablets by mouth one time daily for 10 days  #20 x 0   Entered and Authorized by:   Paula Compton MD   Signed by:   Paula Compton MD on 08/21/2009   Method used:   Electronically to  [facility] [address] [phone]* (retail)       [address]       [place], [place]  [address]       Ph: [phone]       Fax: [phone]   RxID:   [id number] CYCLOBENZAPRINE HCL 10 MG TABS (CYCLOBENZAPRINE HCL) SIG: Take 1 tab by mouth every 8 hours as needed for muscular pain  #30 x 0   Entered and Authorized by:   Paula Compton MD   Signed by:   Paula Compton MD on 08/21/2009   Method used:   Print then Give to Patient   RxID:   [id number]

## 2010-08-31 NOTE — Progress Notes (Signed)
  Phone Note Outgoing Call   Call placed by: Paula Compton MD,  Dec 29, 2009 12:27 PM Call placed to: [facility] Summary of Call: I called [facility] Victorino Dike [place], [place] [phone], fax [phone]) regarding Elisea.  I discussing Aundreya's case and her difficulties with transportation, Victorino Dike believes that Kazzandra may need more enhanced services.  She would like to see Shealeigh in her office, possibly a form of targeted case management Nurse, adult Care).  She will touch base with me after assessing further.  Initial call taken by: Paula Compton MD,  Dec 29, 2009 12:30 PM  Follow-up for Phone Call        called pt and delivered message. Follow-up by: Tessie Fass CMA,  Dec 29, 2009 4:52 PM     Appended Document:  Spoke w/ pt and she cannot get in touch with Victorino Dike.  pls advise

## 2010-08-31 NOTE — Progress Notes (Signed)
Summary: phn msg  Phone Note Call from Patient   Caller: Patient Summary of Call: Pt wanted to let Dr. Mauricio Po know she was doing better. Initial call taken by: Clydell Hakim,  December 31, 2009 11:58 AM  Follow-up for Phone Call        fwd. to Dr.Kase Shughart Follow-up by: Arlyss Repress CMA,,  December 31, 2009 12:00 PM    I called Lakechia back, she is feeling better. Spoke with her psychiatrist office at [facility], raised Seroquel to 600mg  at bedtime.  They wrote the Rx for the increased dose of Neurontin to get at [facility] until we can get established at [facility] or on discount drug plan a local pharmacy. Paula Compton MD  January 01, 2010 10:53 AM
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
## 2010-08-31 NOTE — Miscellaneous (Signed)
Summary: ROI  ROI   Imported By: Clydell Hakim 12/31/2009 15:29:17  _____________________________________________________________________  External Attachment:    Type:   Image     Comment:   External Document
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
## 2010-08-31 NOTE — Letter (Signed)
Summary: Free & Clear   Free & Clear   Imported By: Clydell Hakim 11/19/2009 11:25:40  _____________________________________________________________________  External Attachment:    Type:   Image     Comment:   External Document
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
## 2010-08-31 NOTE — Progress Notes (Signed)
Summary: meds prob/wait. for pt to call back/ts  Phone Note Call from Patient Call back at Home Phone [phone]   Caller: Patient Summary of Call: pt states that she usually takes 150mg  and it was called into [facility] - wants to know what dosage she is suppoed to be taking cheaper at [facility] Initial call taken by: De Nurse,  February 03, 2010 10:37 AM  Follow-up for Phone Call        states she used to take elavil 150 mg . wants to go back on that dose. states the flexeril is not helping. told her pcp will nt be back until next week. she is ok waiting for that response Follow-up by: Golden Circle RN,  February 03, 2010 10:38 AM  Additional Follow-up for Phone Call Additional follow up Details #1::        called pt and lm to call us back. see Dr.Breen's note. Additional Follow-up by: Arlyss Repress CMA,,  February 08, 2010 9:58 AM    Additional Follow-up for Phone Call Additional follow up Details #2::    pt never called back Follow-up by: Arlyss Repress CMA,,  February 11, 2010 11:56 AM  Patient med list states Amitriptyline 50mg  one time daily; She may go up to 100mg  daily for the coming 2 weeks and stop the Flexeril.  It is fine to reissue the Rx for double the current amount to accommodate increased dose.  Please contact patient with this information.  Paula Compton MD  February 08, 2010 9:03 AM

## 2010-08-31 NOTE — Assessment & Plan Note (Signed)
Summary: sore throat/[place]   Vital Signs:  Patient profile:   40 year old female Height:      64 inches Weight:      291.8 pounds BMI:     50.27 Temp:     97.8 degrees F oral Pulse rate:   79 / minute BP sitting:   125 / 84  (left arm) Cuff size:   regular  Vitals Entered By: Garen Grams LPN (November 13, 2009 8:52 AM) CC: sore throat Is Patient Diabetic? No Pain Assessment Patient in pain? no        Primary Care Provider:  Paula Compton MD  CC:  sore throat.  History of Present Illness: 2 week h/o sore throat and hoarseness.  Hoarseness is intermittent but getting better.  Occasional cough.  No dyspnea, wheeze, fever or rash.  +/- nasal discharge.  Some LEFT ear fullness bot no otalgia.  Previously treated for sore throat with Bactrim at [facility], originally better but now worse.  Patient's main concern is throat cancer, as her grandfather had this.  Continues to smoke, but has cut back to 1/2 ppd from 1 ppd.  Ready to quit.  Habits & Providers  Alcohol-Tobacco-Diet     Tobacco Status: current     Tobacco Counseling: to quit use of tobacco products     Cigarette Packs/Day: 0.5  Allergies (verified): No Known Drug Allergies  Review of Systems       Per HPI.  Physical Exam  Additional Exam:  VITALS:  Reviewed, normal GEN: Alert & oriented, no acute distress, obese NECK: Midline trachea, no masses/thyromegaly, no cervical lymphadenopathy CARDIO: Regular rate and rhythm, no murmurs/rubs/gallops, 2+ bilateral radial pulses RESP: Clear to auscultation, normal work of breathing, no retractions/accessory muscle use SKIN: Intact, no lesions EYES:  Scant biltaral conjunctival inflammation noted. EOMI. No vision RIGHT eye. EARS:  LEFT ear: fluid behind TM without injection/bulge/retraction NOSE:  Nasal mucosa are pink and moist without lesions or exudates. MOUTH:  Oral mucosa and oropharynx without lesions or exudates.    Impression & Recommendations:  Problem # 1:  SORE  THROAT (ICD-462) Assessment Deteriorated Afebrile, no tender LAD, no tonsillar exudate, Strep negative. Likely postnasal drip from allergic rhinitis.  Cetirizine, Flonase. Patient's real concern is throat cancer--reassured, but given persistant hoarseness advised RTC 2 weeks if still hoarse. Counselled on smoking (see below). Orders: Rapid Strep-[facility] (13086) [facility]- Est  Level 4 (57846)  Problem # 2:  TOBACCO ABUSE (ICD-305.1) Assessment: Unchanged  Ready to quit.  Down to 1/2 ppd from 1 ppd.  Quitline Referral FAXed.  Orders: [facility]- Est  Level 4 (99214)  Complete Medication List: 1)  Zoloft 100 Mg Tabs (Sertraline hcl) .... Sig: take 2 tablets in the morning 2)  Wellbutrin Sr 150 Mg Xr12h-tab (Bupropion hcl) .... Sig: take 1 tablet by mouth in the morning 3)  Cvs Omeprazole 20 Mg Tbec (Omeprazole) .Marland Kitchen.. 1 tab by mouth once daily disp generic equivalent offered by health department 4)  Proventil Hfa 108 (90 Base) Mcg/act Aers (Albuterol sulfate) .... Sig: use 2 puffs every 4 hours as needed for shortness of breath.  disp 1 hfa, of the generic/walmart brand that is cheapest 5)  Clonazepam 0.5 Mg Tabs (Clonazepam) .... One three times a day as needed 6)  Seroquel 400 Mg Tabs (Quetiapine fumarate) .... One at bedtime 7)  Gabapentin 300 Mg Caps (Gabapentin) .... One four times a day (1200mg  total daily dose) 8)  Buspirone Hcl 10 Mg Tabs (Buspirone hcl) .Marland KitchenMarland KitchenMarland Kitchen 1  tab by mouth two times a day 9)  Cyclobenzaprine Hcl 10 Mg Tabs (Cyclobenzaprine hcl) .... Sig: take 1 tab by mouth every 8 hours as needed for muscular pain 10)  Cyclobenzaprine Hcl 10 Mg Tabs (Cyclobenzaprine hcl) .... Sig: take 1 tab by mouth every 12 hours for up to two weeks. 11)  Cetirizine Hcl 10 Mg Tabs (Cetirizine hcl) .Marland Kitchen.. 1 tab by mouth daily (can be bought over the counter) 12)  Fluticasone Propionate 50 Mcg/act Susp (Fluticasone propionate) .Marland Kitchen.. 1 spray each nose once daily for allergies  Patient Instructions: 1)  Pleasure to  meet you today. 2)  Use Cetirizine (over the counter) and Fluticasone (prescription sent) as below. 3)  Please schedule a follow-up appointment in 2 weeks with Dr. Mauricio Po if still hoarse.  Prescriptions: FLUTICASONE PROPIONATE 50 MCG/ACT SUSP (FLUTICASONE PROPIONATE) 1 spray each nose once daily for allergies  #1 x 3   Entered and Authorized by:   Romero Belling MD   Signed by:   Romero Belling MD on 11/13/2009   Method used:   Electronically to        [facility] [phone]* (retail)       [address]       [place], [place]  [address]       Ph: [phone]       Fax: [phone]   RxID:   [id number]   Laboratory Results  Date/Time Received: November 13, 2009 8:47 AM  Date/Time Reported: November 13, 2009 9:04 AM   Other Tests  Rapid Strep: negative Comments: ...............test performed by......Marland KitchenBonnie A. [place], MLS (ASCP)cm

## 2010-08-31 NOTE — Miscellaneous (Signed)
Summary: Procedures consent  Procedures consent   Imported By: De Nurse 12/18/2009 15:13:34  _____________________________________________________________________  External Attachment:    Type:   Image     Comment:   External Document

## 2010-08-31 NOTE — Progress Notes (Signed)
  Phone Note Outgoing Call   Call placed by: Paula Compton MD,  Dec 16, 2009 10:39 AM Call placed to: Patient Summary of Call: Called and spoke with Dois Davenport about the negative elbow x-ray.  She is to continue with sling, monitoring.  The elbow is not red or warm, but continues to look and feel like it did at last visit.  If continues this way or worsens, she is to call for re-check.  Initial call taken by: Paula Compton MD,  Dec 16, 2009 10:40 AM

## 2010-08-31 NOTE — Consult Note (Signed)
Summary: [facility]   Imported By: Bradly Bienenstock 10/30/2009 16:26:14  _____________________________________________________________________  External Attachment:    Type:   Image     Comment:   External Document

## 2010-08-31 NOTE — Progress Notes (Signed)
Summary: refill  Phone Note Refill Request Call back at Home Phone [phone] Message from:  Patient  Refills Requested: Medication #1:  [facility] OMEPRAZOLE 20 MG TBEC 1 tab by mouth once daily DISP GENERIC EQUIVALENT OFFERED BY HEALTH DEPARTMENT  Medication #2:  PROVENTIL HFA 108 (90 BASE) MCG/ACT AERS SIG: Use 2 puffs every 4 hours as needed for shortness of breath.  DISP 1 HFA pt lost her bottle and needs it called into [facility] HEALTH DEPT  Initial call taken by: De Nurse,  January 20, 2010 11:23 AM  Follow-up for Phone Call        pt informed, Follow-up by: De Nurse,  January 21, 2010 8:40 AM    Prescriptions: [facility] OMEPRAZOLE 20 MG TBEC (OMEPRAZOLE) 1 tab by mouth once daily DISP GENERIC EQUIVALENT OFFERED BY HEALTH DEPARTMENT  #30 x 12   Entered and Authorized by:   Paula Compton MD   Signed by:   Paula Compton MD on 01/21/2010   Method used:   Faxed to ...       [facility] (retail)       [address]       [place], [place]  [address]       Ph: [phone]       Fax: [phone]   RxID:   [id number] PROVENTIL HFA 108 (90 BASE) MCG/ACT AERS (ALBUTEROL SULFATE) SIG: Use 2 puffs every 4 hours as needed for shortness of breath.  DISP 1 HFA, of the generic/Walmart brand that is cheapest  #8 Gram x 5   Entered and Authorized by:   Paula Compton MD   Signed by:   Paula Compton MD on 01/21/2010   Method used:   Faxed to ...       [facility] (retail)       [address]       [place], [place]  [address]       Ph: [phone]       Fax: [phone]   RxID:   [id number]  Please call to let her know it is done.  Paula Compton MD  January 21, 2010 8:06 AM'

## 2010-08-31 NOTE — Progress Notes (Signed)
Summary: meds prob  Phone Note Call from Patient Call back at [phone]   Caller: Patient Summary of Call: wants to be taken off of amytriptoline and get an rx for flexeril [facility] [address] Initial call taken by: De Nurse,  October 05, 2009 8:56 AM  Follow-up for Phone Call        states she was juct in here for this. wants flexeril for the spasms in her back. states it works better than elavil. Takes elavil HS.  takes advil as needed but does not stop the pain. has tried the hot/cold treatment. states cold feels best. once the temp gets to room temp, the pain & spasms are back.   [facility] [address] Follow-up by: Golden Circle RN,  October 05, 2009 9:00 AM  Additional Follow-up for Phone Call Additional follow up Details #1::        I called it in to pharmacy. asked them to label-call md for appt Additional Follow-up by: Golden Circle RN,  October 06, 2009 9:35 AM    New/Updated Medications: CYCLOBENZAPRINE HCL 10 MG TABS (CYCLOBENZAPRINE HCL) SIG: Take 1 tab by mouth every 12 hours for up to two weeks. Prescriptions: CYCLOBENZAPRINE HCL 10 MG TABS (CYCLOBENZAPRINE HCL) SIG: Take 1 tab by mouth every 12 hours for up to two weeks.  #28 x 0   Entered and Authorized by:   Paula Compton MD   Signed by:   Paula Compton MD on 10/05/2009   Method used:   Print then Give to Patient   RxID:   [id number]  Prescription written, may forward to her [facility] [address]  She is given 2 weeks' supply, then to re-evaluate. Paula Compton MD  October 05, 2009 9:04 AM

## 2010-08-31 NOTE — Progress Notes (Signed)
Summary: fyi/ts  Phone Note Call from Patient   Caller: Patient Summary of Call: called to cancel appt for this afternoon since she went to UC this morning.  has bronchitis and UTI. Initial call taken by: De Nurse,  October 20, 2009 11:59 AM

## 2010-08-31 NOTE — Progress Notes (Signed)
Summary: triage  Phone Note Call from Patient Call back at Home Phone [phone]   Caller: Patient Summary of Call: Pt has hurt her elbow and is in pain? Initial call taken by: Clydell Hakim,  Dec 10, 2009 11:35 AM  Follow-up for Phone Call        fell on it around easter time. went to UC. R elbow is swollen. no xrays done at [facility]. they gave her motrin 800mg . states that is not helping. feels like bone on bone per pt. unable to get a ride here today. appt with pcp tomorrow at 9:15 Follow-up by: Golden Circle RN,  Dec 10, 2009 11:40 AM

## 2010-08-31 NOTE — Progress Notes (Signed)
Summary: triage  Phone Note Call from Patient Call back at Home Phone [phone]   Caller: Patient Summary of Call: went to UC about 2 weeks ago for sore throat and was given antibiotics - has another sore throat and wants meds call in Initial call taken by: De Nurse,  November 12, 2009 2:13 PM  Follow-up for Phone Call        went to [facility] & rec'd 10 day antibiotic & cough syrup with codiene. sore throat is back. worried because a family member had throat cancer. continues to smoke. told her to quit smoking. appt tomorrow at 8:15 as I am working her in with pcp. told her to gargle with warm salt water & drink plenty of fluids Follow-up by: Golden Circle RN,  November 12, 2009 2:18 PM

## 2010-08-31 NOTE — Assessment & Plan Note (Signed)
Summary: r hip pain/lgk/breem   Vital Signs:  Patient profile:   40 year old female Height:      64 inches Weight:      293 pounds Temp:     97.6 degrees F Pulse rate:   85 / minute BP sitting:   152 / 90  (right arm)  Primary Care Provider:  Paula Compton MD   History of Present Illness: 40 yo female here as work-in pt.  C/o of right sided back pain that started about a week ago.  Pt states pain is in her lower right back and goes all the way down to her knee. States pain feels like a pulling of her muscles. No known injury or trauma.  Pt has taken Tylenol and tramadol with no relief.  No red flags.  Pt requesting steroid injection.  Twisted left ankle yesterday, no trauma.  Was walking down the steps and she stepped wrong on the last stair and inverted her foot.  Has used ice with some relief.    Allergies (verified): No Known Drug Allergies  Past History:  Past Medical History: Last updated: 09/02/2008 Sep 02, 2008: Gives hx of bipolar d/o, major depressive d/o, and borderline personality d/o; history of GERD with hiatal hernia.  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 Had detached retina in R eye in 1985.   Risk Factors: Smoking Status: current (06/05/2009) Packs/Day: 0.5 (06/05/2009)  Physical Exam  General:  VS reviewed, well appearing, no apparent distress.  Lungs:  normal respiratory effort and normal breath sounds.  no crackles and no wheezes.   Heart:  normal rate, regular rhythm, and no murmur.  no JVD.     Detailed Back/Spine Exam  Lumbosacral Exam:  Inspection-deformity:    Normal Palpation-spinal tenderness:  Normal Lying Straight Leg Raise:    Right:  positive in back only Contralateral Straight Leg Raise:    Right:  negative Fabere Test:    Right:  negative   Foot/Ankle Exam  General:    obese.    Gait:    limping, walking slowly  Inspection:    swelling  noted at left lateral malleolulus  Palpation:    tenderness L-lateral malleoulus:   Vascular:    normal L-posterior tibial,L-dorsalis pedis, and L-popliteal.    Sensory:    gross sensation intact bilaterally in lower extremities.    Ankle Exam:    Right:    Inspection:  Normal    Palpation:  Normal    Stability:  stable    Tenderness:  no    Swelling:  no    Erythema:  no    Left:    Inspection:  Abnormal    Palpation:  Abnormal       Location:  lateral malleoulus    Stability:  stable    Tenderness:  yes    Swelling:  yes    Erythema:  no  Tinel's of Foot:    +L posterior tibial.    Anterior Drawer:    Left negative Syndesmosis Squeeze Test:    Left negative Ankle External Rotation Test:    Left negative First MTP Stability Test:    Left negative Drawer Text of Lesser Toe MTP:    Left negative   Impression & Recommendations:  Problem # 1:  SPRAIN AND STRAIN OF LUMBOSACRAL (ICD-846.0) Assessment Unchanged  Will give Toradol 15mg  IM today.  Pt states  this helped last time.  Exam was unremarkale, no red flags, and as this is a chronic issue, I do not feel imaging is necc at this time.  To take Tylenol for pain.  Orders: [facility]- Est Level  3 (57846)  Problem # 2:  ANKLE SPRAIN, LEFT (ICD-845.00) Assessment: New  Does not meet [place] ankle criteria to warrent an xray at this time.  Pt is able to bear weight and walk.  Advised ice, elevation and rest.  Toradol should help for this as well.  To f/u if gets worse.  See pt instructions.  Orders: [facility]- Est Level  3 (96295)  Complete Medication List: 1)  Zoloft 100 Mg Tabs (Sertraline hcl) .... Sig: take1 tablets in the morning 2)  Wellbutrin Sr 150 Mg Xr12h-tab (Bupropion hcl) .... Sig: take 1 tablet by mouth in the morning 3)  Amitriptyline Hcl 100 Mg Tabs (Amitriptyline hcl) .... Sig: take 1 tab by mouth once daily at bedtime 4)  [facility] Omeprazole 20 Mg Tbec (Omeprazole) .Marland Kitchen.. 1 tab by mouth once daily disp generic  equivalent offered by health department 5)  Proventil Hfa 108 (90 Base) Mcg/act Aers (Albuterol sulfate) .... Sig: use 2 puffs every 4 hours as needed for shortness of breath.  disp 1 hfa, of the generic/[facility] brand that is cheapest 6)  Clonazepam 0.5 Mg Tabs (Clonazepam) .... One three times a day as needed 7)  Seroquel 400 Mg Tabs (Quetiapine fumarate) .... One at bedtime 8)  Gabapentin 300 Mg Caps (Gabapentin) .... One three times a day 9)  Buspirone Hcl 10 Mg Tabs (Buspirone hcl) .Marland Kitchen.. 1 tab by mouth two times a day 10)  Amitriptyline Hcl 50 Mg Tabs (Amitriptyline hcl) .... Sig: take one tablet, along with the 100mg  tablet, nightly for a total of 150mg /day 11)  Trazodone Hcl 100 Mg Tabs (Trazodone hcl) .Marland Kitchen.. 1&1/2 tablets by mouth once at bedtime  Patient Instructions: 1)  Nice to meet you. 2)  I will give you a shot today that will help with your back pain and ankle. 3)  Continue to take Tylenol every 6 hours as needed for the pain 4)  I will wrap your ankle in an Ace bandage.  Continue to use ice for 20 minutes every hour as you can.  Elevate your ankle as well.  If it is not improving or gets worse, please come back and see Dr. Mauricio Po. 5)  I printed out your last labs for you

## 2010-08-31 NOTE — Progress Notes (Signed)
Summary: Rx Req  Phone Note Refill Request Call back at Home Phone [phone] Message from:  Patient  Refills Requested: Medication #1:  CYCLOBENZAPRINE HCL 10 MG TABS SIG: Take 1 tab by mouth every 12 hours for up to two weeks. PT USES [facility] [address]  Initial call taken by: Clydell Hakim,  Dec 01, 2009 10:13 AM  Follow-up for Phone Call        Pt notified rx was at pharmacy. Follow-up by: Clydell Hakim,  Dec 02, 2009 9:39 AM    Prescriptions: CYCLOBENZAPRINE HCL 10 MG TABS (CYCLOBENZAPRINE HCL) SIG: Take 1 tab by mouth every 8 hours as needed for muscular pain  #60 x 0   Entered and Authorized by:   Paula Compton MD   Signed by:   Paula Compton MD on 12/01/2009   Method used:   Electronically to        [facility] [id number]* (retail)       [address]       [place], [place]  [address]       Ph: [phone]       Fax: [phone]   RxID:   [id number]  Rx complete.  Please notify patient. Paula Compton MD  Dec 01, 2009 12:34 PM

## 2010-09-02 NOTE — Progress Notes (Signed)
Summary: ROI  ROI   Imported By: De Nurse 08/12/2010 16:38:46  _____________________________________________________________________  External Attachment:    Type:   Image     Comment:   External Document

## 2010-10-16 LAB — CBC
HCT: 40.9 % (ref 36.0–46.0)
MCHC: 34.9 g/dL (ref 30.0–36.0)
Platelets: 277 10*3/uL (ref 150–400)
RDW: 14.2 % (ref 11.5–15.5)
WBC: 9.5 10*3/uL (ref 4.0–10.5)

## 2010-10-16 LAB — RAPID URINE DRUG SCREEN, HOSP PERFORMED
Amphetamines: POSITIVE — AB
Tetrahydrocannabinol: POSITIVE — AB

## 2010-10-16 LAB — COMPREHENSIVE METABOLIC PANEL
ALT: 27 U/L (ref 0–35)
Albumin: 4.2 g/dL (ref 3.5–5.2)
Alkaline Phosphatase: 106 U/L (ref 39–117)
Calcium: 9.7 mg/dL (ref 8.4–10.5)
GFR calc Af Amer: >60 mL/min (ref >60)
Glucose, Bld: 115 mg/dL — ABNORMAL HIGH (ref 70–99)
Potassium: 4.2 mEq/L (ref 3.5–5.1)
Sodium: 139 mEq/L (ref 135–145)
Total Protein: 8.3 g/dL (ref 6.0–8.3)

## 2010-10-16 LAB — ETHANOL: Alcohol, Ethyl (B): <5 mg/dL (ref 0–10)

## 2010-10-16 LAB — DIFFERENTIAL
Eosinophils Absolute: 0.1 10*3/uL (ref 0.0–0.7)
Lymphs Abs: 1.5 10*3/uL (ref 0.7–4.0)
Monocytes Absolute: 0.5 10*3/uL (ref 0.1–1.0)
Monocytes Relative: 5 % (ref 3–12)
Neutrophils Relative %: 77 % (ref 43–77)

## 2010-10-16 LAB — URINALYSIS, ROUTINE W REFLEX MICROSCOPIC
Glucose, UA: NEGATIVE mg/dL
Ketones, ur: NEGATIVE mg/dL
Nitrite: NEGATIVE
Specific Gravity, Urine: 1.027 (ref 1.005–1.030)
pH: 6 (ref 5.0–8.0)

## 2010-10-16 LAB — ACETAMINOPHEN LEVEL: Acetaminophen (Tylenol), Serum: <10 ug/mL — ABNORMAL LOW (ref 10–30)

## 2010-10-16 LAB — URINE CULTURE
Colony Count: NO GROWTH
Culture: NO GROWTH

## 2010-10-16 LAB — TRICYCLICS SCREEN, URINE: TCA Scrn: POSITIVE — AB

## 2010-10-17 LAB — COMPREHENSIVE METABOLIC PANEL
BUN: 13 mg/dL (ref 6–23)
Calcium: 9.2 mg/dL (ref 8.4–10.5)
Creatinine, Ser: 0.85 mg/dL (ref 0.4–1.2)
Glucose, Bld: 90 mg/dL (ref 70–99)
Total Protein: 7.9 g/dL (ref 6.0–8.3)

## 2010-10-17 LAB — DIFFERENTIAL
Basophils Relative: 1 % (ref 0–1)
Lymphocytes Relative: 39 % (ref 12–46)
Lymphs Abs: 3 10*3/uL (ref 0.7–4.0)
Monocytes Relative: 8 % (ref 3–12)
Neutro Abs: 3.9 10*3/uL (ref 1.7–7.7)
Neutrophils Relative %: 49 % (ref 43–77)

## 2010-10-17 LAB — CBC
HCT: 39.9 % (ref 36.0–46.0)
MCHC: 34.4 g/dL (ref 30.0–36.0)
MCV: 88.2 fL (ref 78.0–100.0)
RDW: 12.9 % (ref 11.5–15.5)

## 2010-10-17 LAB — URINALYSIS, ROUTINE W REFLEX MICROSCOPIC
Glucose, UA: NEGATIVE mg/dL
Hgb urine dipstick: NEGATIVE
Ketones, ur: NEGATIVE mg/dL
pH: 6.5 (ref 5.0–8.0)

## 2010-10-17 LAB — RAPID URINE DRUG SCREEN, HOSP PERFORMED
Amphetamines: NOT DETECTED
Barbiturates: NOT DETECTED
Benzodiazepines: POSITIVE — AB
Cocaine: NOT DETECTED

## 2010-10-24 LAB — CBC
HCT: 41 % (ref 36.0–46.0)
MCV: 89.3 fL (ref 78.0–100.0)
Platelets: 279 10*3/uL (ref 150–400)
RBC: 4.6 MIL/uL (ref 3.87–5.11)
WBC: 10.1 10*3/uL (ref 4.0–10.5)

## 2010-10-24 LAB — POCT URINALYSIS DIP (DEVICE)
Bilirubin Urine: NEGATIVE
Ketones, ur: NEGATIVE mg/dL
pH: 6 (ref 5.0–8.0)

## 2010-10-24 LAB — URINE MICROSCOPIC-ADD ON

## 2010-10-24 LAB — DIFFERENTIAL
Eosinophils Absolute: 0.3 10*3/uL (ref 0.0–0.7)
Eosinophils Relative: 3 % (ref 0–5)
Lymphs Abs: 4.2 10*3/uL — ABNORMAL HIGH (ref 0.7–4.0)
Monocytes Absolute: 0.7 10*3/uL (ref 0.1–1.0)
Monocytes Relative: 7 % (ref 3–12)

## 2010-10-24 LAB — URINALYSIS, ROUTINE W REFLEX MICROSCOPIC
Glucose, UA: NEGATIVE mg/dL
Hgb urine dipstick: NEGATIVE
Ketones, ur: NEGATIVE mg/dL
pH: 5.5 (ref 5.0–8.0)

## 2010-10-24 LAB — BASIC METABOLIC PANEL
BUN: 11 mg/dL (ref 6–23)
Chloride: 102 mEq/L (ref 96–112)
Potassium: 4 mEq/L (ref 3.5–5.1)

## 2011-04-11 ENCOUNTER — Ambulatory Visit (INDEPENDENT_AMBULATORY_CARE_PROVIDER_SITE_OTHER): Payer: 59 | Admitting: Family Medicine

## 2011-04-11 ENCOUNTER — Encounter: Payer: Self-pay | Admitting: Family Medicine

## 2011-04-11 DIAGNOSIS — B49 Unspecified mycosis: Secondary | ICD-10-CM

## 2011-04-11 MED ORDER — FLUCONAZOLE 50 MG PO TABS: 150.0000 mg | ORAL_TABLET | ORAL | Status: AC

## 2011-04-11 MED ORDER — TERBINAFINE HCL 1 % EX CREA
TOPICAL_CREAM | Freq: Two times a day (BID) | CUTANEOUS | Status: AC
Start: 2011-04-11 — End: 2012-04-10

## 2011-04-11 NOTE — Assessment & Plan Note (Addendum)
Plan to treat with oral fluconazole x 6 weeks and topical terbinafine.Pt advised to avoid any further bleach exposure. Will follow up in 1 week to reassess finger.

## 2011-04-11 NOTE — Progress Notes (Signed)
  Subjective:    Patient ID: Vanessa Wood, female    DOB: 06-15-1971, 40 y.o.   MRN: [id number]  HPI R foot and L ring finger rash x 1 month. Was initially on foot approx 2 months prior to finger. Then was noticed on ring finger. No direct nail involvement. No fevers.  Pt states that she had a flare of this in her R foot last year that resolved with OTC topical antifungals. Pt states that her foot hygiene has been good including daily foot washings, chaniging socks daily.  Pt has been putting finger in clorox to help get rid of rash. This has made rash worse. Has had minimal improvement with OTC antifungals.    Review of Systems See HPI    Objective:   Physical Exam Gen: in bed NAD Skin: Generalized crusting/scaling in R foot.  L ring finger:        Assessment & Plan:

## 2011-04-11 NOTE — Patient Instructions (Signed)
Athlete's Foot Athlete's foot (Tinea Pedis) is an infection of the skin on the feet. It often occurs on the skin between or underneath the toes, but also on the soles of the feet. Athlete's foot is more likely to occur in hot, humid weather. It may occur in anyone, but not washing feet or changing socks frequently enough contributes to this. It may be spread to other people by sharing towels or shower stalls. CAUSE Athlete's foot is caused by a fungus (not a worm, despite the nickname ringworm). This is a very small (microscopic) germ that thrives in warm, moist environments such as sweaty feet and other parts of the body. The fungus usually spreads from one person to another. This happens from sharing shower stalls, sharing towels, wet floors, etc. People with weakened immune systems, including people with diabetes, may be more prone to athlete's foot. SYMPTOMS  Itchy areas between the toes or on the soles of your feet.   White, flakey, or scaly areas between the toes or on the soles of the feet.   Tiny, intensely itchy blisters between the toes or on the soles of the feet.   The skin can develop tiny cuts. These cuts can develop further into an infection from bacteria (another kind of germ).   The adjacent toenails may be thickened or discolored.  Athlete's foot does not usually cause fever or generalized illness. DIAGNOSIS Your caregiver usually will recognize athlete's foot based on appearance. If the diagnosis is uncertain, then the rash area can be scraped. Then, the small particles of the skin can be examined more closely. This could include your caregiver examining the specimen under a microscope. It might involve sending the specimen to a lab to see if the fungus can be grown. This could take several weeks to get a result. TREATMENT The first step in treatment is prevention:  Do not share towels.   Wear sandals or other foot protection in wet areas such as locker rooms, shared showers  and public swimming pools.   Keep your feet dry. Wear shoes that allow air to circulate and use cotton or wool socks.  Many products are available to treat athlete's foot. Most are medications that kill fungus. They are available as powders or creams. Some require a prescription, some do not. Your caregiver or pharmacist can make a suggestion. Do not use steroid creams on athlete's foot. HOME CARE INSTRUCTIONS  Use medications as prescribed.   Keep your feet clean and cool with daily washes. Dry them well, especially between your toes.   Change your socks every day. Use cotton or wool socks. It is helpful to go barefoot. If you go barefoot, even in the household, be careful not to cause other injuries. (Stepping on toothpicks and needles can require Emergency Department visits!) In hot climates, it may be necessary to change socks 2 to 3 times per day. Wear sandals or canvas tennis shoes during treatment.   If you have blisters, soak your feet in Burrow's or an Epsom salt solution for 20 to 30 minutes, 2 times a day to dry out the blisters. Be sure to thoroughly pat dry, or air dry, your feet.   To keep infection from returning, keep wearing cotton or wool socks and dry your feet well after washing. Wipe between your toes at bedtime.   Fungal infections are hard to get rid of and respond slowly. Continue the treatment even if your athlete's foot does not seem to be getting better. Treatment may  be needed for several weeks.  SEEK MEDICAL CARE IF:  You develop a temperature with no other apparent cause.   You develop swelling and soreness & redness (inflammation) in your foot.   The infection is spreading. Your foot becomes swollen, hot and red (inflamed).   Treatment is not helping.   Athlete's foot is not better in 7 days or completely cured in 30 days.   You have any problems that may be related to the medicine you are taking.  MAKE SURE YOU:   Understand these instructions.   Will  watch your condition.   Will get help right away if you are not doing well or get worse.  Document Released: 07/15/2000 Document Re-Released: 06/30/2008 [facility] Patient Information 2011 [place], [place].

## 2011-04-22 LAB — DIFFERENTIAL
Basophils Absolute: 0.1
Lymphocytes Relative: 25
Monocytes Absolute: 1
Neutro Abs: 7.4
Neutrophils Relative %: 63

## 2011-04-22 LAB — COMPREHENSIVE METABOLIC PANEL
Albumin: 3.9
BUN: 9
Chloride: 103
Creatinine, Ser: 0.74
GFR calc non Af Amer: >60
Total Bilirubin: 0.7

## 2011-04-22 LAB — CBC
HCT: 44
MCV: 90.7
Platelets: 288
WBC: 11.8 — ABNORMAL HIGH

## 2011-04-22 LAB — POCT PREGNANCY, URINE: Preg Test, Ur: NEGATIVE

## 2011-04-22 LAB — CARBAMAZEPINE LEVEL, TOTAL
Carbamazepine Lvl: 25.2
Carbamazepine Lvl: 37.8
Carbamazepine Lvl: 38.4

## 2011-04-22 LAB — RAPID URINE DRUG SCREEN, HOSP PERFORMED
Cocaine: POSITIVE — AB
Opiates: NOT DETECTED

## 2011-04-22 LAB — SALICYLATE LEVEL: Salicylate Lvl: <4

## 2011-04-26 ENCOUNTER — Encounter: Payer: Self-pay | Admitting: Family Medicine

## 2011-04-26 ENCOUNTER — Ambulatory Visit (INDEPENDENT_AMBULATORY_CARE_PROVIDER_SITE_OTHER): Payer: 59 | Admitting: Family Medicine

## 2011-04-26 VITALS — BP 120/81 | HR 89 | Temp 98.0°F | Ht 66.0 in | Wt 284.0 lb

## 2011-04-26 DIAGNOSIS — B49 Unspecified mycosis: Secondary | ICD-10-CM

## 2011-04-26 DIAGNOSIS — L258 Unspecified contact dermatitis due to other agents: Secondary | ICD-10-CM

## 2011-04-26 DIAGNOSIS — L309 Dermatitis, unspecified: Secondary | ICD-10-CM

## 2011-04-26 DIAGNOSIS — L302 Cutaneous autosensitization: Secondary | ICD-10-CM | POA: Insufficient documentation

## 2011-04-26 DIAGNOSIS — B353 Tinea pedis: Secondary | ICD-10-CM

## 2011-04-26 DIAGNOSIS — L259 Unspecified contact dermatitis, unspecified cause: Secondary | ICD-10-CM

## 2011-04-26 DIAGNOSIS — Z23 Encounter for immunization: Secondary | ICD-10-CM

## 2011-04-26 MED ORDER — TRIAMCINOLONE ACETONIDE 0.5 % EX OINT: TOPICAL_OINTMENT | Freq: Two times a day (BID) | CUTANEOUS | Status: AC

## 2011-04-26 NOTE — Patient Instructions (Signed)
Keep feet clean and dry. This may take another few weeks to completely clear.

## 2011-04-26 NOTE — Assessment & Plan Note (Signed)
Patient has ID reaction on her fingers from distant fungal infection of the right toes s/p treatment. PLAN:  Triamcinolone 0.5% cream BID to affected areas.

## 2011-04-26 NOTE — Assessment & Plan Note (Signed)
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 She has been using the cream and oral fluconazole as prescribed. Plan:  continue using lamisal cream as prescribed.  Use betadine swabs BID   Keep feet clean and dry

## 2011-04-26 NOTE — Progress Notes (Signed)
  Subjective:    Patient ID: Vanessa Wood, female    DOB: September 04, 1970, 40 y.o.   MRN: [id number]  HPI 1. Fungal Infection right toes/soles Patient has been treating her fungal infection of the feet as prescribed. She has persistent infection. 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 She has no fever.   2. ID reaction Patient has a reaction appearing like an ID reaction on her fingers in the web spaces.    Review of Systems No fever. No new infections. No animal contact. No immunodeficiency.    Objective:   Physical Exam FEET: scaling red patchy ras on the inter-web spaces of the right foot. HAND: patchy dry scaling rash on the interdigit spaces of the left hand.    Assessment & Plan:

## 2011-05-12 LAB — COMPREHENSIVE METABOLIC PANEL
ALT: 12
Albumin: 3.3 — ABNORMAL LOW
Alkaline Phosphatase: 86
Calcium: 8.5
GFR calc Af Amer: >60
Potassium: 4.4
Sodium: 139
Total Protein: 6.2

## 2011-05-12 LAB — URINE MICROSCOPIC-ADD ON

## 2011-05-12 LAB — DIFFERENTIAL
Basophils Relative: 1
Eosinophils Absolute: 0
Lymphs Abs: 1.6
Monocytes Absolute: 0.5
Monocytes Relative: 6
Neutro Abs: 5.4

## 2011-05-12 LAB — CBC
Platelets: 280
RDW: 12.9

## 2011-05-12 LAB — URINALYSIS, ROUTINE W REFLEX MICROSCOPIC
Glucose, UA: NEGATIVE
Protein, ur: NEGATIVE
pH: 6

## 2011-05-17 LAB — URINALYSIS, ROUTINE W REFLEX MICROSCOPIC
Bilirubin Urine: NEGATIVE
Glucose, UA: NEGATIVE
Ketones, ur: NEGATIVE
Leukocytes, UA: NEGATIVE
Protein, ur: NEGATIVE
pH: 8

## 2011-05-17 LAB — I-STAT 8, (EC8 V) (CONVERTED LAB)
Acid-Base Excess: 1
Bicarbonate: 25.9 — ABNORMAL HIGH
Glucose, Bld: 91
Hemoglobin: 13.9
Potassium: 4.8
Sodium: 135
TCO2: 27

## 2011-05-17 LAB — DIFFERENTIAL
Basophils Absolute: 0.1
Eosinophils Relative: 4
Lymphocytes Relative: 31
Lymphs Abs: 2.5
Neutro Abs: 4.8
Neutrophils Relative %: 58

## 2011-05-17 LAB — POCT PREGNANCY, URINE
Operator id: 146091
Preg Test, Ur: NEGATIVE

## 2011-05-17 LAB — CBC
Platelets: 276
RDW: 13.1
WBC: 8.2

## 2011-05-17 LAB — POCT I-STAT CREATININE
Creatinine, Ser: 0.8
Operator id: 146091

## 2011-05-17 LAB — URINE MICROSCOPIC-ADD ON

## 2011-10-11 IMAGING — CR DG ANKLE COMPLETE 3+V*L*
3 series · 3 of 3 positions shown · non-contrast
Comparison: None.

CLINICAL DATA: status post fall.

LEFT ANKLE COMPLETE - 3+ VIEW

[view not recorded (1 of 3)]
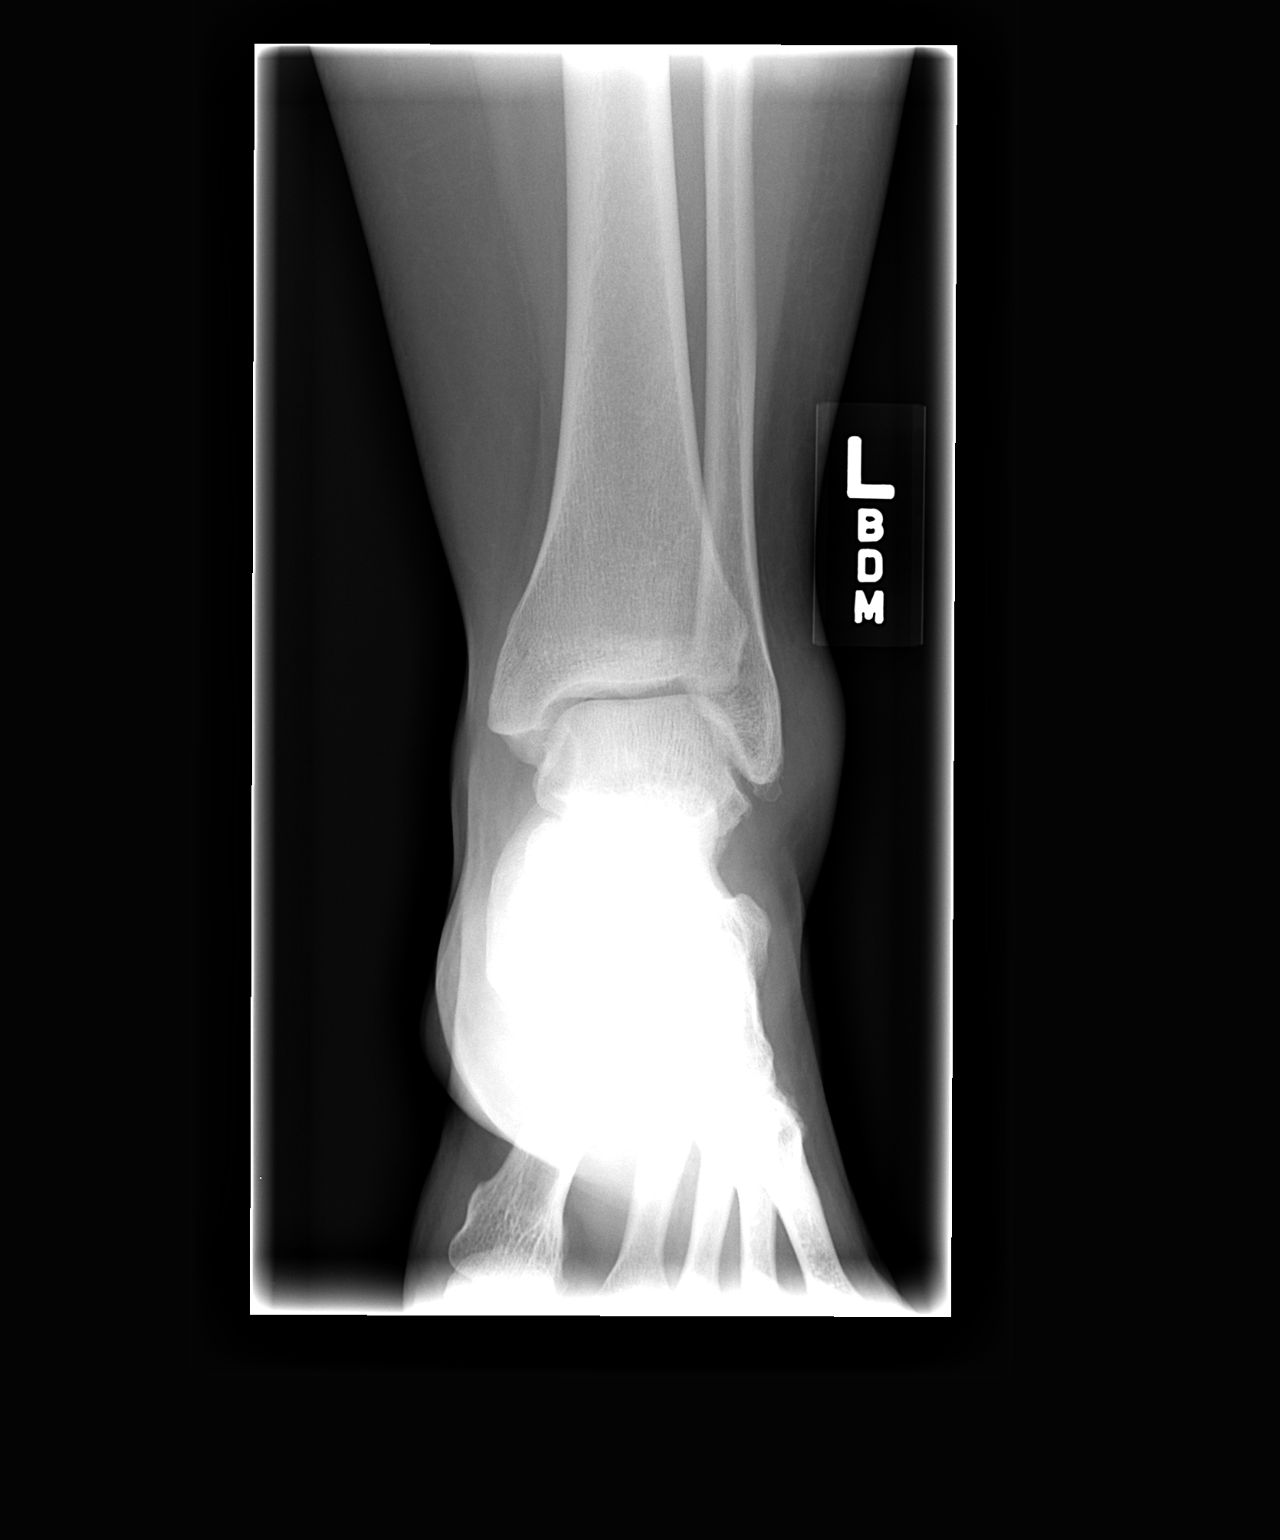

[view not recorded (2 of 3)]
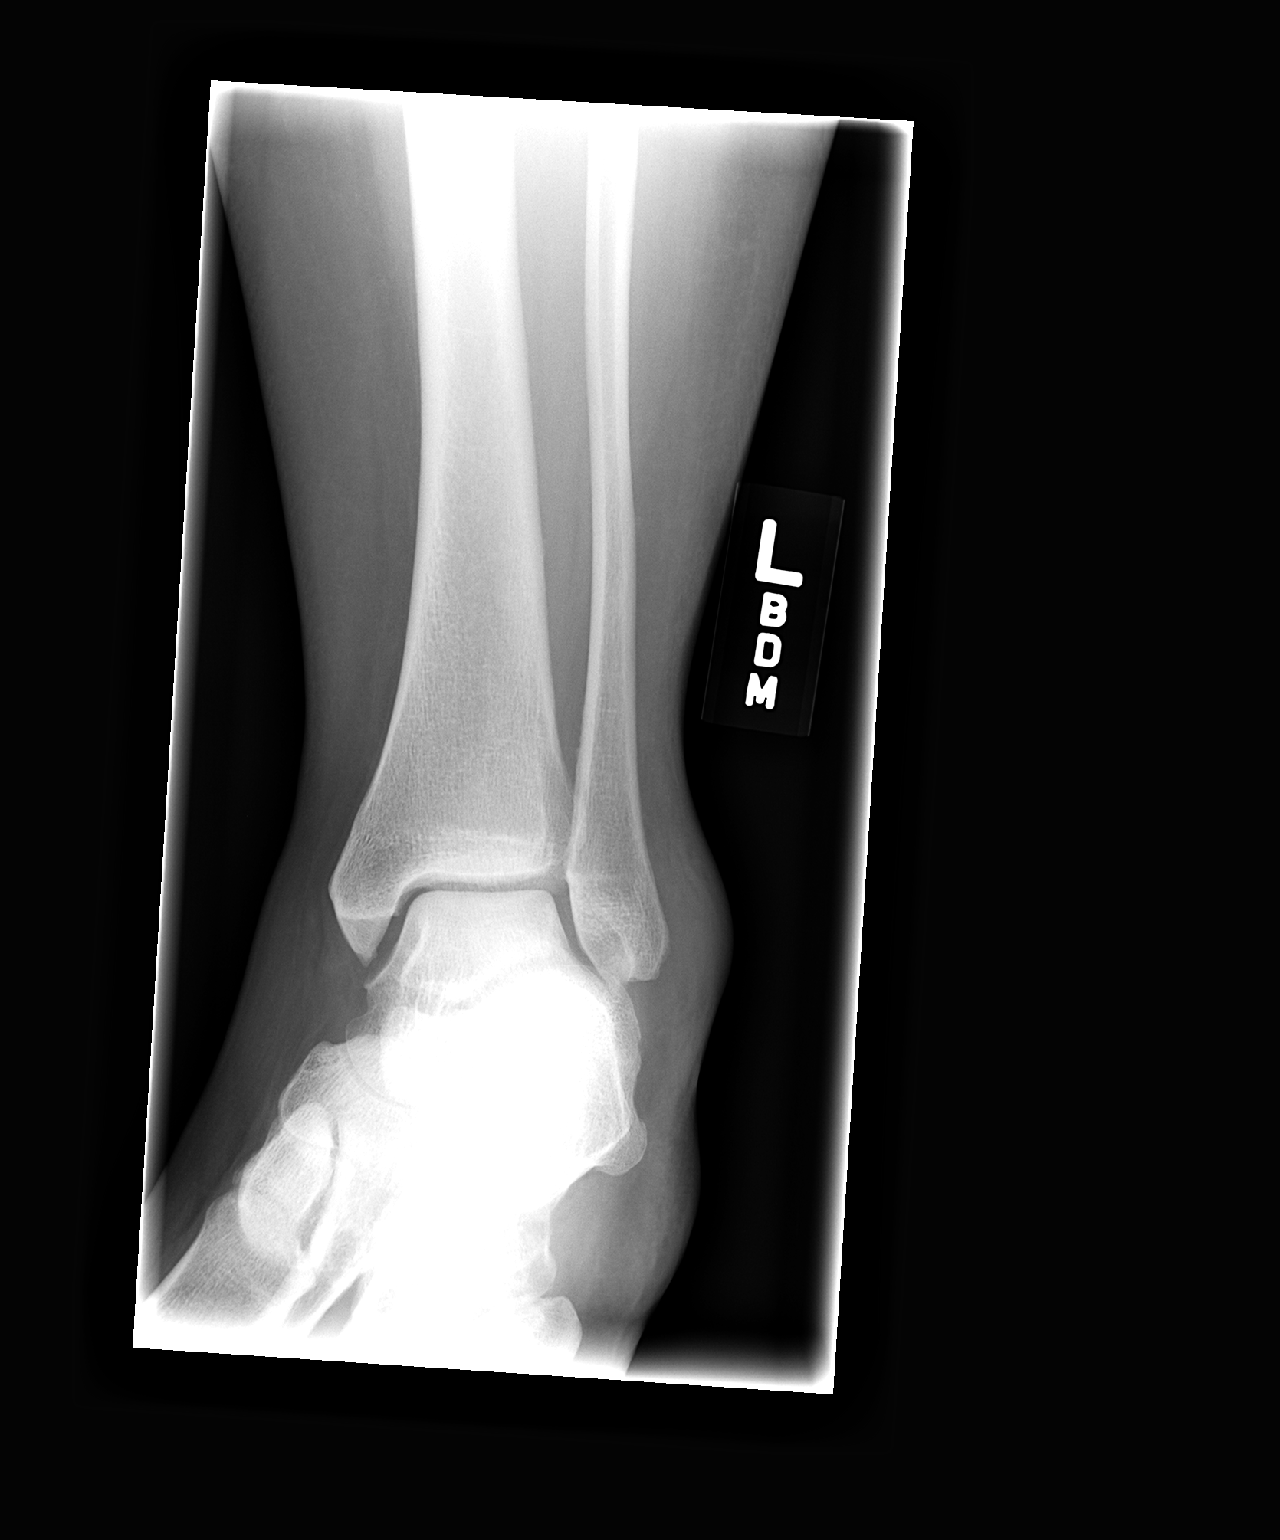

[view not recorded (3 of 3)]
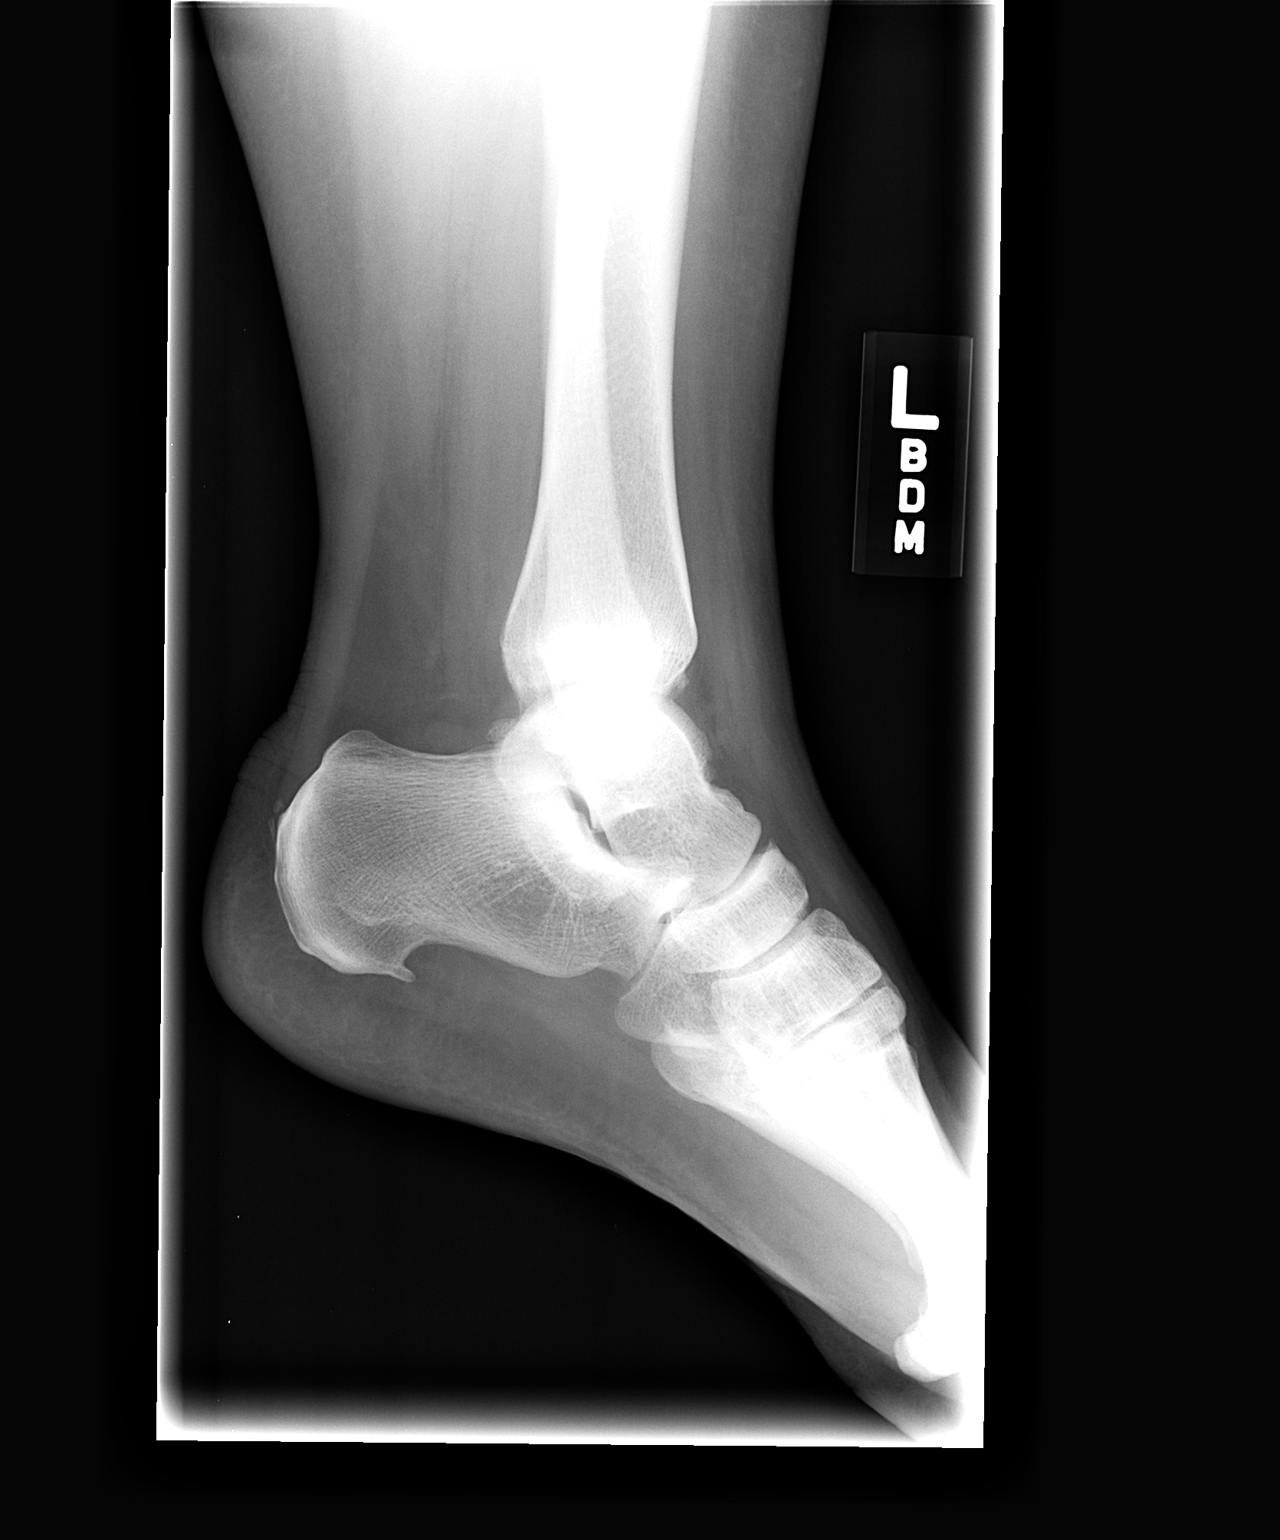

[3 of 3 positions shown; findings below may reference images not displayed]

FINDINGS: Lateral soft tissue swelling is noted.

No fracture or dislocation identified.

There is a small plantar heel spur.
IMPRESSION: 1.  Lateral soft tissue swelling.

## 2011-11-15 ENCOUNTER — Ambulatory Visit (INDEPENDENT_AMBULATORY_CARE_PROVIDER_SITE_OTHER): Payer: Self-pay | Admitting: Family Medicine

## 2011-11-15 VITALS — BP 114/62 | HR 100 | Ht 64.0 in | Wt 284.9 lb

## 2011-11-15 DIAGNOSIS — J3489 Other specified disorders of nose and nasal sinuses: Secondary | ICD-10-CM

## 2011-11-15 DIAGNOSIS — K219 Gastro-esophageal reflux disease without esophagitis: Secondary | ICD-10-CM

## 2011-11-15 MED ORDER — OMEPRAZOLE 20 MG PO CPDR
40.0000 mg | DELAYED_RELEASE_CAPSULE | Freq: Every day | ORAL | Status: DC
Start: 2011-11-15 — End: 2012-04-10

## 2011-11-15 MED ORDER — CYCLOBENZAPRINE HCL 10 MG PO TABS: 10.0000 mg | ORAL_TABLET | Freq: Every day | ORAL | Status: AC

## 2011-11-15 MED ORDER — CETIRIZINE HCL 10 MG PO TABS: 10.0000 mg | ORAL_TABLET | Freq: Every day | ORAL | Status: DC

## 2011-11-15 NOTE — Assessment & Plan Note (Signed)
Recent low back injury while lifting infirmed grandfather.  Will give short term muscle relaxant, not to exceed 10 days.  She has used in the past, for extended period of time.  I explained that  This is not to become a regular medication.
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
## 2011-11-15 NOTE — Assessment & Plan Note (Signed)
HIstory hiatal hernia, no colic or RUQ tenderness or megaly on exam.  Restarted PPI today; sheis counseled to return if she continues with regular emesis, will need endoscopic evaluation.  Dietary modification.

## 2011-11-15 NOTE — Patient Instructions (Signed)
I sent prescriptions to [facility]:   Zyrtec 10mg  one time daily;  Omeprazole (Prilosec) 20mg , take 2 daily (40mg /day) Cyclobenzaprine 10mg  one at bedtime for the next 10 days as needed: muscle relaxant, may cause drowsiness, do not take while driving.   I ask that you contact me if you continue with abdominal pain/nausea, or daily vomiting over the coming month.  This may mean we need to do more evaluation for this problem.

## 2011-12-30 ENCOUNTER — Ambulatory Visit: Payer: Self-pay | Admitting: Family Medicine

## 2012-03-30 ENCOUNTER — Ambulatory Visit (INDEPENDENT_AMBULATORY_CARE_PROVIDER_SITE_OTHER): Payer: Self-pay | Admitting: Family Medicine

## 2012-03-30 ENCOUNTER — Encounter: Payer: Self-pay | Admitting: Family Medicine

## 2012-03-30 VITALS — BP 117/87 | HR 69 | Temp 98.2°F | Ht 64.0 in | Wt 253.4 lb

## 2012-03-30 DIAGNOSIS — R112 Nausea with vomiting, unspecified: Secondary | ICD-10-CM | POA: Insufficient documentation

## 2012-03-30 DIAGNOSIS — R61 Generalized hyperhidrosis: Secondary | ICD-10-CM | POA: Insufficient documentation

## 2012-03-30 DIAGNOSIS — F319 Bipolar disorder, unspecified: Secondary | ICD-10-CM

## 2012-03-30 DIAGNOSIS — K920 Hematemesis: Secondary | ICD-10-CM | POA: Insufficient documentation

## 2012-03-30 LAB — COMPREHENSIVE METABOLIC PANEL
ALT: 10 U/L (ref 0–35)
AST: 14 U/L (ref 0–37)
CO2: 26 mEq/L (ref 19–32)
Calcium: 9.1 mg/dL (ref 8.4–10.5)
Chloride: 104 mEq/L (ref 96–112)
Creat: 0.78 mg/dL (ref 0.50–1.10)
Sodium: 136 mEq/L (ref 135–145)
Total Bilirubin: 0.3 mg/dL (ref 0.3–1.2)
Total Protein: 6.9 g/dL (ref 6.0–8.3)

## 2012-03-30 LAB — CBC WITH DIFFERENTIAL/PLATELET
Basophils Absolute: 0 10*3/uL (ref 0.0–0.1)
Eosinophils Relative: 2 % (ref 0–5)
Lymphocytes Relative: 41 % (ref 12–46)
Lymphs Abs: 4.8 10*3/uL — ABNORMAL HIGH (ref 0.7–4.0)
MCV: 90.2 fL (ref 78.0–100.0)
Neutrophils Relative %: 51 % (ref 43–77)
Platelets: 352 10*3/uL (ref 150–400)
RBC: 4.57 MIL/uL (ref 3.87–5.11)
RDW: 14.2 % (ref 11.5–15.5)
WBC: 11.7 10*3/uL — ABNORMAL HIGH (ref 4.0–10.5)

## 2012-03-30 MED ORDER — SUCRALFATE 1 G PO TABS: 1.0000 g | ORAL_TABLET | Freq: Four times a day (QID) | ORAL | Status: DC

## 2012-03-30 MED ORDER — ONDANSETRON HCL 4 MG PO TABS: 4.0000 mg | ORAL_TABLET | Freq: Three times a day (TID) | ORAL | Status: AC | PRN

## 2012-03-30 NOTE — Assessment & Plan Note (Signed)
Patient with dx bipolar d/o; currently not on meds.  Will ask her to resume her quetiapine and to call Dr. Pascal Lux to be considered for [facility].  Vanessa Wood states she has never been seen in [facility] here before. JB

## 2012-03-30 NOTE — Progress Notes (Signed)
Subjective:     Patient ID: Vanessa Wood, female   DOB: 1971-07-28, 41 y.o.   MRN: [id number]  HPI 41 yo female presents to clinic with c/c of nausea and vomiting. Pt states that she has had N/V for the past 3 months.  She reports vomiting 3x/d over this period and the nausea is worse in the morning. Pt reports seeing what appeared to be blood in her vomit on 3 separate occasions over the past 3 months. There is no association with intake of food.  She has a past hx of GERD, PUD, and a hiatal hernia and was previously taking omeprazole daily but has recently stopped stating that it was not working.  She smokes 1/2 pack of cigarettes per day and does not drink alcohol (reports being sober for 2 years).  She reports eating less due to nausea but able to tolerate good PO intake of H2O.  Patient has had 31lb unplanned weight loss over past 4 months and reports night sweats over the past 3 months. ~3 soft stools/day  Attending Note Patient seen and examined by me in conjunction with medical student Billey Gosling (MS3 [facility]), I agree with his documentation with following additions: Vanessa Wood has continued with N/V, approx 3 times/day, vomitus looks like food.  Has had three episodes streaking blood in vomitus over that time.  Has no fevers but has had hot flashes/sweats that she associates with possibly being peri-menopause.  LMP mid-August; has noted decreased flow in menses which last for 3 days, over the past year.  No changes in bowel habits, has stools after eating.  No blood in stool.  She denies any recent alcohol or drug use. JB   Mental Health: -Patient reports that she has not taken any of her psyciatric medications for the past 6 months (the only medication she has been taking is omeprazole).  Pt states her she has felt down and depressed consistently for the past 3 months. Pt has increase stress in her life as she has been caring for her grandfather who has pancreatic cancer over the past 5 months.   She is sleeping only 3-5 hrs/night and reports racing thoughts and physical restlessness.  Pt also reports difficult concentrating in school.  She has not been seen by [facility] in [facility] [place] since January.  She denies suicidal ideation of plans to hurt herself.  Pt has been sober from alcohol for 2 years and denies any recent use or desire to use illicit drugs.  Patient feeling very stressed with the responsibilities of caring for her grandfather, who has pancreatic cancer.  Has stopped taking her medications, including her quetiapine and trazodone.  Denies SI or SA; had previously been followed by [facility] but stopped going; wishes to be seen elsewhere for her mental health issues. JB  Review of Systems Denies fevers, chills, blood in stool, constipation, difficulty breathing, SOB, chest pain w/ radiation. LMP mid-August, 2013.  Penultimate menses mid-July. Does not know age at which mother went through menopause. NO abdominal surgeries in history. 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 No cervical LAD, no asymmetric deviation with swallowing Lungs: clear to ascultation bilaterally, nml work of breathing Heart: regular rate and rhythm Abdomen: obese, faint bowel sounds, tenderness to deep palpation of the LLQ, abdomen does not appear distended, no epigastric tenderness to  palpation Extremities: no LE edema, cap refill < 2 seconds EXAM: Generally well, tearful and emotionally labile when talking about stressors. Not jaundiced. HEENT Neck supple, no cervical adenopathy.  COR Regular S1S2, no extra sounds PULM Clear bilaterally, no rales or wheezes ABD Soft, nontender, no organomegaly. Audible bowel sounds.  No masses noted. JB     Assessment:     Well hydrated 41 yo female with 3 month past hx of GERD and PUD presents with nausea/vomiting,  31 lb unplanned weight loss, possible night sweats, a few episodes of possible hematemesis.  Mostly likely due to GERD/PUD but need to r/o other concerning causes such as cancer.  Less likely to be due to pancreatitis but will check lipase.  Other possible causes include bowel obstruction but patient is have ~3 stools per day which decreases my suspicion.  We will address psychiatric concerns at later date.  Pt is not suicidal.    Plan:     Nausea/Vomiting: -labs drawn (CBC, CMET, Lipase, FSH) -abdominal CT scheduled -Zofran & Sulfacrate prescribed -f/u visit in 1-2 weeks NAUSEA/VOMITING WITH 30# WEIGHT LOSS: History of PUD; also of concern for other processes such as hiatal hernia, neoplasms of GI tract/pancreas.  Less likely SBO.  WIll begin workup with labs today to assess lytes and transaminases/alk phos; CBC.  To order CT abd/pelvis with contrast to evaluate for possible neoplasm.  She appears very stable for outpatient workup at this time.  Does not appear dehydrated or septic/acutely ill at this point. Close follow up in the coming 1-2 weeks. Discussed reasons for more rapid follow up. JB Psych: -referral to Ander Purpura in mood disorder clinic Patient with dx bipolar d/o; currently not on meds.  Will ask her to resume her quetiapine and to call Dr. Pascal Lux to be considered for Mood Disorders Clinic.  Tarrie states she has never been seen in [facility] here before. JB

## 2012-03-30 NOTE — Patient Instructions (Addendum)
We are glad that you came to see [place].  We are giving you a prescription that will help coat your stomach and a medicine for nausea  Lab are being drawn today.  We will call you with the results @ [phone]  Your CT scan is being scheduled. We will call you with the results form this once they are in.  We want you to schedule an appointment with Ander Purpura in our mood disorder clinic.  She will be a great resource for you  We want to see you back in clinic in 1-2 weeks

## 2012-03-30 NOTE — Assessment & Plan Note (Signed)
NAUSEA/VOMITING WITH 30# WEIGHT LOSS: History of PUD; also of concern for other processes such as hiatal hernia, neoplasms of GI tract/pancreas.  Less likely SBO.  WIll begin workup with labs today to assess lytes and transaminases/alk phos; CBC.  To order CT abd/pelvis with contrast to evaluate for possible neoplasm.  She appears very stable for outpatient workup at this time.  Does not appear dehydrated or septic/acutely ill at this point. Close follow up in the coming 1-2 weeks. Discussed reasons for more rapid follow up. JB

## 2012-04-04 ENCOUNTER — Encounter (HOSPITAL_COMMUNITY): Payer: Self-pay

## 2012-04-04 ENCOUNTER — Ambulatory Visit (HOSPITAL_COMMUNITY)
Admission: RE | Admit: 2012-04-04 | Discharge: 2012-04-04 | Disposition: A | Discharge status: Home / Self Care | Payer: Self-pay | Source: Ambulatory Visit | Attending: Family Medicine | Admitting: Family Medicine

## 2012-04-04 ENCOUNTER — Telehealth: Payer: Self-pay | Admitting: Family Medicine

## 2012-04-04 DIAGNOSIS — R109 Unspecified abdominal pain: Secondary | ICD-10-CM | POA: Insufficient documentation

## 2012-04-04 DIAGNOSIS — R110 Nausea: Secondary | ICD-10-CM | POA: Insufficient documentation

## 2012-04-04 DIAGNOSIS — K920 Hematemesis: Secondary | ICD-10-CM

## 2012-04-04 DIAGNOSIS — R634 Abnormal weight loss: Secondary | ICD-10-CM | POA: Insufficient documentation

## 2012-04-04 MED ORDER — IOHEXOL 300 MG/ML  SOLN
100.0000 mL | Freq: Once | INTRAMUSCULAR | Status: AC | PRN
  Administered 2012-04-04: 100 mL via INTRAVENOUS

## 2012-04-04 NOTE — Telephone Encounter (Signed)
Called patient cell phone, left voice mail for her to call.  I wish to let her know of her normal CT scan, and the results of her lab work, which was largely reassuring.  I still want her to see the GI doctors as part of her workup.  JB

## 2012-04-06 ENCOUNTER — Other Ambulatory Visit: Payer: Self-pay | Admitting: *Deleted

## 2012-04-10 ENCOUNTER — Encounter: Payer: Self-pay | Admitting: Family Medicine

## 2012-04-10 ENCOUNTER — Ambulatory Visit: Payer: Self-pay | Admitting: Gynecologic Oncology

## 2012-04-10 ENCOUNTER — Ambulatory Visit (INDEPENDENT_AMBULATORY_CARE_PROVIDER_SITE_OTHER): Payer: Self-pay | Admitting: Family Medicine

## 2012-04-10 VITALS — BP 119/84 | HR 74 | Temp 98.3°F | Ht 64.0 in | Wt 248.0 lb

## 2012-04-10 DIAGNOSIS — F319 Bipolar disorder, unspecified: Secondary | ICD-10-CM

## 2012-04-10 DIAGNOSIS — R112 Nausea with vomiting, unspecified: Secondary | ICD-10-CM

## 2012-04-10 MED ORDER — QUETIAPINE FUMARATE 50 MG PO TABS: 50.0000 mg | ORAL_TABLET | Freq: Every day | ORAL | Status: DC

## 2012-04-10 MED ORDER — SUCRALFATE 1 G PO TABS: 1.0000 g | ORAL_TABLET | Freq: Four times a day (QID) | ORAL | Status: DC

## 2012-04-10 MED ORDER — OMEPRAZOLE 20 MG PO CPDR: 40.0000 mg | DELAYED_RELEASE_CAPSULE | Freq: Every day | ORAL | Status: DC

## 2012-04-10 NOTE — Progress Notes (Signed)
Subjective:     Patient ID: Vanessa Wood, female   DOB: 1971-03-11, 41 y.o.   MRN: [id number]  HPI 41 yo female presents to clinic for f/u of nausea and vomiting. Since being seen last, pt states that her symptoms have not changed.  Pt was unable to fill zofran and sulfacrate prescriptions due to cost.  She is not taking omeprzole due to cost.  She continues to have vomiting 3x/d over and the nausea is worse in the morning. Burning sensation from stomach to throat.  Pt continues to report seeing what appeared to be blood in her vomit but is unsure if it is food. She is only able to eat 1 meal/day and often is not able to keep food down.  Reports that she is able to tolerate H2O and some soda. She has 5 lb weight loss over past 10 days (36 lb weight loss of past 4 1/2 months).  Pt believes that her mood and social situation are contributing to her physical symptoms.  She has a past hx of GERD, PUD, and a hiatal hernia.   Attending Note Patient seen and examined by me with student Billey Gosling (MS3 [employer]), I agree with his documentation and have the following additions/clarifications:  Vanessa Wood reports continued nausea, with about 1-2 episodes of emesis ("clear, white") that is worse in the mornings before she attempts breakfast.  No fevers or chills; she does have decreased appetite as well.  Recounts episode in the past week when she went to [employer] and had dinner with her family; threw up suddenly while driving home.  Her [employer] Card has lapsed in validity, she has not gotten around to completing the required paperwork to renew it.  She had to miss classes all last week due to her illness.  Reviewed results of her labs and CT scan.  She has been unable to get meds as we discussed, because of cost.  JB Mental Health:  -Since being last seen, pt restarted taking her seroquel 50 mg at night to help calm her racing thoughts.  She continues to have racing thoughts at night and feelings  of physical restlessness.  Reports sleeping on average ~3 hrs/night over past 10 days.  Her mood has been continued to be down since last being seen but she states that she has been in better spirits since hearing that her labs and CT were nml.  Pt has increase stress in her life as she has been caring for her grandfather who has pancreatic cancer over the past 5 months. She has not been able to go to MAP program at [facility] department for assistance with her medications.  She does not want trazadone b/c she wants to be able to wake up at night to care for her grandfather.  [facility] by [facility] since January. She denies suicidal ideation of plans to hurt herself. Pt has been sober from alcohol for 2 years and denies any recent use or desire to use illicit drugs.  Agree. JB  Review of Systems Denies fevers, blood in stools, cough, SOB, recent illnesses    Objective:   Physical Exam Vitals: Temp 98.3, BP 119/84 (machine), 138/76 (manual), HR 74 General: in no acute distress Lungs: clear to ascultation bilaterally Heart: RRR Abdomen: non-distended, obese, tender to palpation in mid-epigastric region, bowel sounds present, no rebound tenderness  Exam: Well appearing, no apparent distress. Mucus membranes moist.  Neck supple and without adenopathy.  COR Regular S1S2 ABD soft,  nontender, nondistended. Audible bowel sounds. JB Recent Test: -Abdominal CT: nml -CBC: minor elevation of WBC (11.7) -CMET: nml -Lipase: nml -FSH: nml    Assessment:     41 yo female with 3-4 month of nausea/vomiting w/possible blood, 36 lb unplanned weight loss. Mostly likely due to GERD/PUD but could also be due to gastroparesis/motility disorder.  Will order small bowel follow-through test to look for motility issue and also help r/o bowel obstruction.  Little concern for abdominal cancer at this point based on normal abdominal CT scan.  Symptoms likely compounded by psychiatric issues and social situation.         Plan:     Nausea/Vomiting:  -Previous labs (CBC, CMET, Lipase, FSH) were nml.  No need to recheck at this point -Small bowel follow through test ordered.  Will need to have this done at [facility] or [facility] due to financial situation -Pt encouraged to continue drinking fluids to prevent dehydration -Pt appears stable enough to not admit to hospital at this point   Psych:  -Seroquel refill give to patient -Need to f/u with Ander Purpura in mood disorder clinic   -Need to f/u with MAP program at [facility] Dept here in [place] to help with cost of psychiatric medications

## 2012-04-11 ENCOUNTER — Telehealth: Payer: Self-pay | Admitting: *Deleted

## 2012-04-11 NOTE — Telephone Encounter (Signed)
Called patient to schedule appointment with GI at [facility] due to patient having orange card. She is declining the referral at this time because of money and will let us know when she is able to afford to set this up. She is scheduled for the gastric emptying study at [facility] on 9/17. Forward to Dr Lorre Nick.Busick, Rodena Medin

## 2012-04-11 NOTE — Assessment & Plan Note (Signed)
Vanessa Wood continues to have symptoms; her CT abd/pelvis and initial lab workup were unremarkable.  I have stressed to her that her dire financial situation does not change what I believe are essential parts of the medical plan for her care.  She knows that it will take special effort on her part to see to it that she gets with GI (suggested to her that we attempt through a local university dept, such as [facility] or [facility]).  She will need to renew her [employer] in the [place] system, although this will not likely help her get an EGD.  As an attempt to evaluate for possible gastric dysmotility, will order gastric emptying study as well. She does not have a diagnosis of DM, but may consider this in light of her chronic problem with nausea/vomiting. JB

## 2012-04-11 NOTE — Assessment & Plan Note (Signed)
She has restarted her Seroquel from leftover pills that she had been taking in the past; she still has not contacted our Psychologist for Mood Disorders Clinic appointment, but expresses interest and intention in doing so.  Re-emphasized again with her today. Printed Rx for her Seroquel, to take to MAP program and attempt to obtain in cost-effective manner for herself. JB

## 2012-04-17 ENCOUNTER — Other Ambulatory Visit (HOSPITAL_COMMUNITY): Payer: Self-pay

## 2012-06-05 ENCOUNTER — Encounter: Payer: Self-pay | Admitting: *Deleted

## 2012-06-05 NOTE — Progress Notes (Signed)
Telephone call from pts husband requesting an appointment.  As pt was a no show or cancelled 3 appts: 04/10/12 cancelled, 04/19/12 cancelled and 05/09/12 no show, her husband is advised to recontact Dr Newman Nip regarding a new referral either here or in [place].  Stated to husband to call us back should he be unable to connect with Dr Newman Nip.  He is provided with Dr Jaci Lazier office #.
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
## 2012-09-07 ENCOUNTER — Encounter (HOSPITAL_COMMUNITY): Payer: Self-pay | Admitting: Emergency Medicine

## 2012-09-07 ENCOUNTER — Emergency Department (HOSPITAL_COMMUNITY)
Admission: EM | Admit: 2012-09-07 | Discharge: 2012-09-07 | Disposition: A | Discharge status: Home / Self Care | Payer: No Typology Code available for payment source | Attending: Emergency Medicine | Admitting: Emergency Medicine

## 2012-09-07 DIAGNOSIS — F329 Major depressive disorder, single episode, unspecified: Secondary | ICD-10-CM | POA: Insufficient documentation

## 2012-09-07 DIAGNOSIS — Y9389 Activity, other specified: Secondary | ICD-10-CM | POA: Insufficient documentation

## 2012-09-07 DIAGNOSIS — Z8659 Personal history of other mental and behavioral disorders: Secondary | ICD-10-CM | POA: Insufficient documentation

## 2012-09-07 DIAGNOSIS — IMO0002 Reserved for concepts with insufficient information to code with codable children: Secondary | ICD-10-CM | POA: Insufficient documentation

## 2012-09-07 DIAGNOSIS — F17200 Nicotine dependence, unspecified, uncomplicated: Secondary | ICD-10-CM | POA: Insufficient documentation

## 2012-09-07 DIAGNOSIS — Y92410 Unspecified street and highway as the place of occurrence of the external cause: Secondary | ICD-10-CM | POA: Insufficient documentation

## 2012-09-07 DIAGNOSIS — M549 Dorsalgia, unspecified: Secondary | ICD-10-CM

## 2012-09-07 HISTORY — DX: Major depressive disorder, single episode, unspecified: F32.9

## 2012-09-07 HISTORY — DX: Bipolar disorder, unspecified: F31.9

## 2012-09-07 MED ORDER — KETOROLAC TROMETHAMINE 60 MG/2ML IM SOLN
60.0000 mg | Freq: Once | INTRAMUSCULAR | Status: AC
  Administered 2012-09-07: 60 mg via INTRAMUSCULAR
  Filled 2012-09-07: qty 2

## 2012-09-07 MED ORDER — OXYCODONE-ACETAMINOPHEN 5-325 MG PO TABS: 1.0000 | ORAL_TABLET | ORAL | Status: DC | PRN

## 2012-09-07 MED ORDER — OXYCODONE-ACETAMINOPHEN 5-325 MG PO TABS
1.0000 | ORAL_TABLET | Freq: Once | ORAL | Status: AC
  Administered 2012-09-07: 1 via ORAL
  Filled 2012-09-07: qty 1

## 2012-09-07 MED ORDER — DIAZEPAM 5 MG PO TABS
5.0000 mg | ORAL_TABLET | Freq: Once | ORAL | Status: AC
  Administered 2012-09-07: 5 mg via ORAL
  Filled 2012-09-07: qty 1

## 2012-09-07 NOTE — ED Provider Notes (Signed)
Medical screening examination/treatment/procedure(s) were performed by non-physician practitioner and as supervising physician I was immediately available for consultation/collaboration.   Charles B. Sheldon, MD 09/07/12 2238 
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
## 2012-09-11 ENCOUNTER — Ambulatory Visit: Payer: Self-pay | Admitting: Family Medicine

## 2012-09-24 ENCOUNTER — Encounter (HOSPITAL_COMMUNITY): Payer: Self-pay | Admitting: *Deleted

## 2012-09-24 ENCOUNTER — Emergency Department (HOSPITAL_COMMUNITY): Payer: No Typology Code available for payment source

## 2012-09-24 ENCOUNTER — Emergency Department (HOSPITAL_COMMUNITY)
Admission: EM | Admit: 2012-09-24 | Discharge: 2012-09-24 | Disposition: A | Discharge status: Home / Self Care | Payer: No Typology Code available for payment source | Attending: Emergency Medicine | Admitting: Emergency Medicine

## 2012-09-24 DIAGNOSIS — Z8659 Personal history of other mental and behavioral disorders: Secondary | ICD-10-CM | POA: Insufficient documentation

## 2012-09-24 DIAGNOSIS — R300 Dysuria: Secondary | ICD-10-CM | POA: Insufficient documentation

## 2012-09-24 DIAGNOSIS — F17200 Nicotine dependence, unspecified, uncomplicated: Secondary | ICD-10-CM | POA: Insufficient documentation

## 2012-09-24 DIAGNOSIS — N12 Tubulo-interstitial nephritis, not specified as acute or chronic: Secondary | ICD-10-CM

## 2012-09-24 LAB — CBC WITH DIFFERENTIAL/PLATELET
Eosinophils Relative: 1 % (ref 0–5)
HCT: 42.7 % (ref 36.0–46.0)
Lymphocytes Relative: 23 % (ref 12–46)
Lymphs Abs: 2.7 10*3/uL (ref 0.7–4.0)
MCH: 31.7 pg (ref 26.0–34.0)
MCV: 87.9 fL (ref 78.0–100.0)
Monocytes Absolute: 0.7 10*3/uL (ref 0.1–1.0)
Monocytes Relative: 6 % (ref 3–12)
RBC: 4.86 MIL/uL (ref 3.87–5.11)
WBC: 11.6 10*3/uL — ABNORMAL HIGH (ref 4.0–10.5)

## 2012-09-24 LAB — URINE MICROSCOPIC-ADD ON

## 2012-09-24 LAB — POCT I-STAT, CHEM 8
BUN: 9 mg/dL (ref 6–23)
Calcium, Ion: 1.16 mmol/L (ref 1.12–1.23)
Creatinine, Ser: 0.7 mg/dL (ref 0.50–1.10)
Glucose, Bld: 94 mg/dL (ref 70–99)
Sodium: 137 mEq/L (ref 135–145)
TCO2: 24 mmol/L (ref 0–100)

## 2012-09-24 LAB — URINALYSIS, ROUTINE W REFLEX MICROSCOPIC
Bilirubin Urine: NEGATIVE
Glucose, UA: NEGATIVE mg/dL
Specific Gravity, Urine: 1.019 (ref 1.005–1.030)

## 2012-09-24 MED ORDER — OXYCODONE-ACETAMINOPHEN 5-325 MG PO TABS: 1.0000 | ORAL_TABLET | ORAL | Status: DC | PRN

## 2012-09-24 MED ORDER — HYDROMORPHONE HCL PF 1 MG/ML IJ SOLN
1.0000 mg | Freq: Once | INTRAMUSCULAR | Status: AC
  Administered 2012-09-24: 1 mg via INTRAVENOUS
  Filled 2012-09-24: qty 1

## 2012-09-24 MED ORDER — SODIUM CHLORIDE 0.9 % IV BOLUS (SEPSIS)
1000.0000 mL | Freq: Once | INTRAVENOUS | Status: AC
  Administered 2012-09-24: 1000 mL via INTRAVENOUS

## 2012-09-24 MED ORDER — MORPHINE SULFATE 4 MG/ML IJ SOLN
4.0000 mg | Freq: Once | INTRAMUSCULAR | Status: AC
  Administered 2012-09-24: 4 mg via INTRAVENOUS
  Filled 2012-09-24: qty 1

## 2012-09-24 MED ORDER — CIPROFLOXACIN HCL 500 MG PO TABS: 500.0000 mg | ORAL_TABLET | Freq: Two times a day (BID) | ORAL | Status: DC

## 2012-09-24 MED ORDER — KETOROLAC TROMETHAMINE 30 MG/ML IJ SOLN
30.0000 mg | Freq: Once | INTRAMUSCULAR | Status: AC
  Administered 2012-09-24: 30 mg via INTRAVENOUS
  Filled 2012-09-24: qty 1

## 2012-09-24 MED ORDER — PROMETHAZINE HCL 25 MG PO TABS: 25.0000 mg | ORAL_TABLET | Freq: Four times a day (QID) | ORAL | Status: DC | PRN

## 2012-09-24 MED ORDER — DEXTROSE 5 % IV SOLN
1.0000 g | Freq: Once | INTRAVENOUS | Status: AC
  Administered 2012-09-24: 1 g via INTRAVENOUS
  Filled 2012-09-24: qty 10

## 2012-09-24 MED ORDER — ONDANSETRON HCL 4 MG/2ML IJ SOLN
4.0000 mg | Freq: Once | INTRAMUSCULAR | Status: AC
  Administered 2012-09-24: 4 mg via INTRAVENOUS
  Filled 2012-09-24: qty 2

## 2012-09-24 NOTE — ED Provider Notes (Signed)
History     CSN: [id number]  Arrival date & time 09/24/12  1553   First MD Initiated Contact with Patient 09/24/12 1830      Chief Complaint  Patient presents with  . Flank Pain    (Consider location/radiation/quality/duration/timing/severity/associated sxs/prior treatment) HPI Comments: Patient is a 42 year old female who presents with a 2 day history of left flank pain. Symptoms started gradually and progressively worsened since the onset. The pain is sharp and severe without radiation. The pain is constant. No aggravating/alleviating factors. Associated symptoms include dysuria. Patient denies fever, abdominal pain, NVD.    Past Medical History  Diagnosis Date  . Bipolar 1 disorder   . Major depressive disorder     Past Surgical History  Procedure Laterality Date  . Eye surgery  1985    No family history on file.  History  Substance Use Topics  . Smoking status: Current Every Day Smoker -- 0.50 packs/day    Types: Cigarettes  . Smokeless tobacco: Not on file     Comment: decrease smoking to 0.5 ppd  . Alcohol Use: Yes     Comment: occ    OB History   Grav Para Term Preterm Abortions TAB SAB Ect Mult Living                  Review of Systems  Genitourinary: Positive for dysuria and flank pain.  All other systems reviewed and are negative.    Allergies  Codeine  Home Medications  No current outpatient prescriptions on file.  BP 143/94  Pulse 107  Temp(Src) 98.2 F (36.8 C)  SpO2 96%  LMP 09/24/2012  Physical Exam  Nursing note and vitals reviewed. Constitutional: She is oriented to person, place, and time. She appears well-developed and well-nourished. No distress.  HENT:  Head: Normocephalic and atraumatic.  Eyes: Conjunctivae and EOM are normal.  Neck: Normal range of motion. Neck supple.  Cardiovascular: Normal rate and regular rhythm.  Exam reveals no gallop and no friction rub.   No murmur heard. Pulmonary/Chest: Effort normal and  breath sounds normal. She has no wheezes. She has no rales. She exhibits no tenderness.  Abdominal: Soft. She exhibits no distension. There is no tenderness. There is no rebound and no guarding.  Genitourinary:  Left CVA tenderness  Musculoskeletal: Normal range of motion.  Neurological: She is alert and oriented to person, place, and time. Coordination normal.  Speech is goal-oriented. Moves limbs without ataxia.   Skin: Skin is warm and dry.  Psychiatric: She has a normal mood and affect. Her behavior is normal.    ED Course  Procedures (including critical care time)  Labs Reviewed  URINALYSIS, ROUTINE W REFLEX MICROSCOPIC - Abnormal; Notable for the following:    APPearance CLOUDY (*)    Hgb urine dipstick LARGE (*)    Ketones, ur 15 (*)    Nitrite POSITIVE (*)    Leukocytes, UA LARGE (*)    All other components within normal limits  CBC WITH DIFFERENTIAL - Abnormal; Notable for the following:    WBC 11.6 (*)    Hemoglobin 15.4 (*)    MCHC 36.1 (*)    Neutro Abs 8.1 (*)    All other components within normal limits  URINE MICROSCOPIC-ADD ON - Abnormal; Notable for the following:    Squamous Epithelial / LPF MANY (*)    Bacteria, UA MANY (*)    All other components within normal limits  POCT I-STAT, CHEM 8 - Abnormal; Notable  for the following:    Hemoglobin 15.6 (*)    All other components within normal limits  URINE CULTURE   Ct Abdomen Pelvis Wo Contrast  09/24/2012  *RADIOLOGY REPORT*  Clinical Data: 42 year old female with left flank, abdominal and pelvic pain.  CT ABDOMEN AND PELVIS WITHOUT CONTRAST  Technique:  Multidetector CT imaging of the abdomen and pelvis was performed following the standard protocol without intravenous contrast.  Comparison: 04/04/2012 and prior CTs  Findings: The liver, gallbladder, spleen, pancreas, adrenal glands and kidneys are unremarkable.  Please note that parenchymal abnormalities may be missed as intravenous contrast was not administered.   No free fluid, enlarged lymph nodes, biliary dilation or abdominal aortic aneurysm identified.  The uterus and adnexal regions are within normal limits. The bowel and appendix are unremarkable.  There may be mild inflammation around the bladder - question infection.  No acute or suspicious bony abnormalities are identified.  IMPRESSION: Possible mild inflammation adjacent to the bladder - question infection.  No other abnormalities identified.   Original Report Authenticated By: Harmon Pier, M.D.      1. Pyelonephritis       MDM  6:42 PM Patient will have toradol and morphine for pain. Urinalysis shows infection. Patient will have CT without contrast to evaluated for possible kidney stone. Patient afebrile.  10:13 PM CT scan shows no stone. Patient likely has pyelonephritis causing her pain. Patient will be discharged with Cipro, Percocet, and phenergan. Patient is afebrile with stable vitals. No further evaluation needed at this time. Patient instructed to return with worsening or concerning symptoms.       Emilia Beck, PA-C 09/24/12 2221

## 2012-09-24 NOTE — ED Notes (Signed)
Patient transported to CT 
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
## 2012-09-25 LAB — URINE CULTURE: Colony Count: >=100000

## 2012-09-26 ENCOUNTER — Telehealth (HOSPITAL_COMMUNITY): Payer: Self-pay | Admitting: Emergency Medicine

## 2012-09-26 NOTE — ED Notes (Signed)
+  Urine. Patient treated with Cipro. Sensitive to same. Per protocol MD. °

## 2012-09-26 NOTE — ED Notes (Signed)
Patient has +Urine culture. Checking to see if appropriately treated. °

## 2012-09-27 NOTE — ED Provider Notes (Signed)
Medical screening examination/treatment/procedure(s) were performed by non-physician practitioner and as supervising physician I was immediately available for consultation/collaboration.  Toy Baker, MD 09/27/12 [phone]

## 2012-11-20 ENCOUNTER — Encounter (HOSPITAL_COMMUNITY): Payer: Self-pay

## 2012-11-20 ENCOUNTER — Emergency Department (HOSPITAL_COMMUNITY)
Admission: EM | Admit: 2012-11-20 | Discharge: 2012-11-20 | Discharge status: Against Medical Advice | Payer: No Typology Code available for payment source | Attending: Emergency Medicine | Admitting: Emergency Medicine

## 2012-11-20 DIAGNOSIS — F17200 Nicotine dependence, unspecified, uncomplicated: Secondary | ICD-10-CM | POA: Insufficient documentation

## 2012-11-20 DIAGNOSIS — R079 Chest pain, unspecified: Secondary | ICD-10-CM | POA: Insufficient documentation

## 2012-11-20 DIAGNOSIS — F411 Generalized anxiety disorder: Secondary | ICD-10-CM | POA: Insufficient documentation

## 2012-11-20 NOTE — ED Notes (Signed)
Pt. Leaving , encouraged pt. To stay.  She stated, "I  Have to go"

## 2012-11-20 NOTE — ED Notes (Signed)
Left pt. In  Triage room for privacy, until she is placed in ER Room.  EKG shown to Dr. Adriana Simas. No orders received.
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
## 2013-06-27 ENCOUNTER — Emergency Department (HOSPITAL_COMMUNITY): Payer: Self-pay

## 2013-06-27 ENCOUNTER — Emergency Department (HOSPITAL_COMMUNITY)
Admission: EM | Admit: 2013-06-27 | Discharge: 2013-06-28 | Disposition: A | Discharge status: Home / Self Care | Payer: Self-pay | Attending: Emergency Medicine | Admitting: Emergency Medicine

## 2013-06-27 ENCOUNTER — Encounter (HOSPITAL_COMMUNITY): Payer: Self-pay | Admitting: Emergency Medicine

## 2013-06-27 DIAGNOSIS — Z3202 Encounter for pregnancy test, result negative: Secondary | ICD-10-CM | POA: Insufficient documentation

## 2013-06-27 DIAGNOSIS — F17200 Nicotine dependence, unspecified, uncomplicated: Secondary | ICD-10-CM | POA: Insufficient documentation

## 2013-06-27 DIAGNOSIS — R110 Nausea: Secondary | ICD-10-CM | POA: Insufficient documentation

## 2013-06-27 DIAGNOSIS — Z8659 Personal history of other mental and behavioral disorders: Secondary | ICD-10-CM | POA: Insufficient documentation

## 2013-06-27 DIAGNOSIS — Z8719 Personal history of other diseases of the digestive system: Secondary | ICD-10-CM | POA: Insufficient documentation

## 2013-06-27 DIAGNOSIS — R109 Unspecified abdominal pain: Secondary | ICD-10-CM | POA: Insufficient documentation

## 2013-06-27 HISTORY — DX: Diverticulitis of intestine, part unspecified, without perforation or abscess without bleeding: K57.92

## 2013-06-27 LAB — COMPREHENSIVE METABOLIC PANEL
ALT: 9 U/L (ref 0–35)
Albumin: 3.4 g/dL — ABNORMAL LOW (ref 3.5–5.2)
Alkaline Phosphatase: 95 U/L (ref 39–117)
BUN: 21 mg/dL (ref 6–23)
CO2: 29 mEq/L (ref 19–32)
Chloride: 101 mEq/L (ref 96–112)
GFR calc Af Amer: 74 mL/min — ABNORMAL LOW (ref >90)
Glucose, Bld: 100 mg/dL — ABNORMAL HIGH (ref 70–99)
Potassium: 4.4 mEq/L (ref 3.5–5.1)
Sodium: 137 mEq/L (ref 135–145)
Total Bilirubin: 0.2 mg/dL — ABNORMAL LOW (ref 0.3–1.2)

## 2013-06-27 LAB — URINALYSIS, ROUTINE W REFLEX MICROSCOPIC
Bilirubin Urine: NEGATIVE
Glucose, UA: NEGATIVE mg/dL
Ketones, ur: NEGATIVE mg/dL
pH: 7.5 (ref 5.0–8.0)

## 2013-06-27 LAB — CBC WITH DIFFERENTIAL/PLATELET
Basophils Absolute: 0 10*3/uL (ref 0.0–0.1)
Eosinophils Relative: 6 % — ABNORMAL HIGH (ref 0–5)
HCT: 43.1 % (ref 36.0–46.0)
Lymphocytes Relative: 44 % (ref 12–46)
Lymphs Abs: 3.6 10*3/uL (ref 0.7–4.0)
MCV: 93.1 fL (ref 78.0–100.0)
Monocytes Absolute: 0.7 10*3/uL (ref 0.1–1.0)
RDW: 13.7 % (ref 11.5–15.5)
WBC: 8.2 10*3/uL (ref 4.0–10.5)

## 2013-06-27 LAB — URINE MICROSCOPIC-ADD ON

## 2013-06-27 MED ORDER — ONDANSETRON HCL 4 MG/2ML IJ SOLN
4.0000 mg | Freq: Once | INTRAMUSCULAR | Status: AC
  Administered 2013-06-27: 4 mg via INTRAVENOUS
  Filled 2013-06-27: qty 2

## 2013-06-27 MED ORDER — SODIUM CHLORIDE 0.9 % IV BOLUS (SEPSIS)
1000.0000 mL | Freq: Once | INTRAVENOUS | Status: AC
  Administered 2013-06-27: 1000 mL via INTRAVENOUS

## 2013-06-27 MED ORDER — MORPHINE SULFATE 4 MG/ML IJ SOLN
4.0000 mg | Freq: Once | INTRAMUSCULAR | Status: AC
  Administered 2013-06-27: 4 mg via INTRAVENOUS
  Filled 2013-06-27: qty 1

## 2013-06-27 MED ORDER — PROMETHAZINE HCL 25 MG/ML IJ SOLN
25.0000 mg | Freq: Once | INTRAMUSCULAR | Status: AC
  Administered 2013-06-27: 25 mg via INTRAVENOUS

## 2013-06-27 MED ORDER — HYDROMORPHONE HCL PF 1 MG/ML IJ SOLN
1.0000 mg | Freq: Once | INTRAMUSCULAR | Status: AC
  Administered 2013-06-27: 1 mg via INTRAVENOUS
  Filled 2013-06-27: qty 1

## 2013-06-27 NOTE — ED Notes (Signed)
Pt vomiting, possible reaction to morphine admin

## 2013-06-27 NOTE — ED Notes (Signed)
Urine cup provide to patient.  Patient states she is unable to void at this time.  rn-kate gave verbal order to provide small amount of water to patient.

## 2013-06-27 NOTE — ED Notes (Signed)
Patient presents today with a chief complaint of bilateral lower quadrant abdominal pain x 2 weeks with radiation to bilateral flanks x 2 days. Last bowel movement today, denies urinary frequency and urgency.

## 2013-06-27 NOTE — ED Provider Notes (Signed)
CSN: [id number]     Arrival date & time 06/27/13  1751 History   First MD Initiated Contact with Patient 06/27/13 1852     Chief Complaint  Patient presents with  . Abdominal Pain   (Consider location/radiation/quality/duration/timing/severity/associated sxs/prior Treatment) HPI Comments: Patient is a 42 year old female with a past medical history of diverticulitis, bipolar disorder and major depressive disorder who presents with abdominal pain for the past 2 days. The pain is located in her left flank and radiates down to her LLQ. The pain is described as aching and severe. The pain started gradually and progressively worsened since the onset. No alleviating/aggravating factors. The patient has tried nothing for symptoms without relief. Associated symptoms include nausea. Patient denies fever, headache, vomiting, diarrhea, chest pain, SOB, dysuria, constipation. Patient reports she is currently menstruating.      Patient is a 42 y.o. female presenting with abdominal pain.  Abdominal Pain Associated symptoms: nausea   Associated symptoms: no chest pain, no chills, no diarrhea, no dysuria, no fatigue, no fever, no shortness of breath and no vomiting     Past Medical History  Diagnosis Date  . Bipolar 1 disorder   . Major depressive disorder   . Diverticulitis    Past Surgical History  Procedure Laterality Date  . Eye surgery  1985   No family history on file. History  Substance Use Topics  . Smoking status: Current Every Day Smoker -- 0.50 packs/day    Types: Cigarettes  . Smokeless tobacco: Not on file     Comment: decrease smoking to 0.5 ppd  . Alcohol Use: Yes     Comment: occ   OB History   Grav Para Term Preterm Abortions TAB SAB Ect Mult Living                 Review of Systems  Constitutional: Negative for fever, chills and fatigue.  HENT: Negative for trouble swallowing.   Eyes: Negative for visual disturbance.  Respiratory: Negative for shortness of breath.    Cardiovascular: Negative for chest pain and palpitations.  Gastrointestinal: Positive for nausea and abdominal pain. Negative for vomiting and diarrhea.  Genitourinary: Negative for dysuria and difficulty urinating.  Musculoskeletal: Negative for arthralgias and neck pain.  Skin: Negative for color change.  Neurological: Negative for dizziness and weakness.  Psychiatric/Behavioral: Negative for dysphoric mood.    Allergies  Codeine  Home Medications   Current Outpatient Rx  Name  Route  Sig  Dispense  Refill  . ibuprofen (ADVIL,MOTRIN) 800 MG tablet   Oral   Take 800 mg by mouth daily as needed for moderate pain.          BP 105/56  Pulse 71  Temp(Src) 97.6 F (36.4 C) (Oral)  Resp 21  SpO2 98%  LMP 06/17/2013 Physical Exam  Nursing note and vitals reviewed. Constitutional: She is oriented to person, place, and time. She appears well-developed and well-nourished. No distress.  HENT:  Head: Normocephalic and atraumatic.  Eyes: Conjunctivae and EOM are normal.  Neck: Normal range of motion.  Cardiovascular: Normal rate and regular rhythm.  Exam reveals no gallop and no friction rub.   No murmur heard. Pulmonary/Chest: Effort normal and breath sounds normal. She has no wheezes. She has no rales. She exhibits no tenderness.  Abdominal: Soft. She exhibits no distension. There is no tenderness. There is no rebound and no guarding.  Genitourinary:  Left CVA tenderness.   Musculoskeletal: Normal range of motion.  Neurological: She is  alert and oriented to person, place, and time. Coordination normal.  Speech is goal-oriented. Moves limbs without ataxia.   Skin: Skin is warm and dry.  Psychiatric: She has a normal mood and affect. Her behavior is normal.    ED Course  Procedures (including critical care time) Labs Review Labs Reviewed  CBC WITH DIFFERENTIAL - Abnormal; Notable for the following:    Neutrophils Relative % 42 (*)    Eosinophils Relative 6 (*)    All  other components within normal limits  URINALYSIS, ROUTINE W REFLEX MICROSCOPIC - Abnormal; Notable for the following:    APPearance CLOUDY (*)    Hgb urine dipstick LARGE (*)    All other components within normal limits  COMPREHENSIVE METABOLIC PANEL - Abnormal; Notable for the following:    Glucose, Bld 100 (*)    Albumin 3.4 (*)    Total Bilirubin 0.2 (*)    GFR calc non Af Amer 64 (*)    GFR calc Af Amer 74 (*)    All other components within normal limits  URINE MICROSCOPIC-ADD ON - Abnormal; Notable for the following:    Squamous Epithelial / LPF FEW (*)    All other components within normal limits  POCT PREGNANCY, URINE   Imaging Review Ct Abdomen Pelvis Wo Contrast  06/28/2013   CLINICAL DATA:  Left flank pain. Left-sided abdominal pain. Nausea.  EXAM: CT ABDOMEN AND PELVIS WITHOUT CONTRAST  TECHNIQUE: Multidetector CT imaging of the abdomen and pelvis was performed following the standard protocol without intravenous contrast.  COMPARISON:  09/24/2012  FINDINGS: The lung bases are clear.  The kidneys appear symmetrical in size and shape. No pyelocaliectasis or ureterectasis. No renal, ureteral, or bladder stones. No bladder wall thickening.  The unenhanced appearance of the liver, spleen, gallbladder, pancreas, adrenal glands, abdominal aorta, inferior vena cava, and retroperitoneal lymph nodes is unremarkable. The stomach, small bowel, and colon are not abnormally distended. No free air or free fluid in the abdomen.  Pelvis: The uterus is anteverted. Uterus and ovaries are not enlarged. The bladder wall is not thickened. No free or loculated pelvic fluid collections. No evidence of diverticulitis. The appendix is normal. Normal alignment of the lumbar spine. No destructive bone lesions appreciated.  IMPRESSION: No renal or ureteral stone or obstruction demonstrated.   Electronically Signed   By: Burman Nieves M.D.   On: 06/28/2013 00:07    EKG Interpretation   None       MDM    1. Flank pain     10:54 PM Labs unremarkable for acute changes. Urinalysis shows hemoglobin likely from menstrual blood. Patient given morphine and zofran for symptoms. Patient will have CT abdomen pelvis without contrast to rule out kidney stone. Vitals stable and patient afebrile.   12:33 AM CT unremarkable for kidney stone. Patient feeling better. Patient will be discharged with pain and nausea medication. Patient instructed to follow up with her PCP.   Emilia Beck, PA-C 06/28/13 [phone]

## 2013-06-28 MED ORDER — PROMETHAZINE HCL 25 MG PO TABS: 25.0000 mg | ORAL_TABLET | Freq: Four times a day (QID) | ORAL | Status: DC | PRN

## 2013-06-28 MED ORDER — OXYCODONE-ACETAMINOPHEN 5-325 MG PO TABS: 2.0000 | ORAL_TABLET | ORAL | Status: DC | PRN

## 2013-07-01 NOTE — ED Provider Notes (Signed)
Medical screening examination/treatment/procedure(s) were performed by non-physician practitioner and as supervising physician I was immediately available for consultation/collaboration.  EKG Interpretation   None         Sargon Scouten B. Bernette Mayers, MD 07/01/13 [phone]

## 2013-08-19 ENCOUNTER — Encounter (HOSPITAL_COMMUNITY): Payer: Self-pay | Admitting: Emergency Medicine

## 2013-08-19 ENCOUNTER — Emergency Department (HOSPITAL_COMMUNITY)
Admission: EM | Admit: 2013-08-19 | Discharge: 2013-08-20 | Disposition: A | Discharge status: Home / Self Care | Payer: No Typology Code available for payment source | Attending: Emergency Medicine | Admitting: Emergency Medicine

## 2013-08-19 DIAGNOSIS — Z79899 Other long term (current) drug therapy: Secondary | ICD-10-CM | POA: Insufficient documentation

## 2013-08-19 DIAGNOSIS — R109 Unspecified abdominal pain: Secondary | ICD-10-CM

## 2013-08-19 DIAGNOSIS — K279 Peptic ulcer, site unspecified, unspecified as acute or chronic, without hemorrhage or perforation: Secondary | ICD-10-CM

## 2013-08-19 DIAGNOSIS — R112 Nausea with vomiting, unspecified: Secondary | ICD-10-CM

## 2013-08-19 DIAGNOSIS — K273 Acute peptic ulcer, site unspecified, without hemorrhage or perforation: Secondary | ICD-10-CM | POA: Insufficient documentation

## 2013-08-19 DIAGNOSIS — Z3202 Encounter for pregnancy test, result negative: Secondary | ICD-10-CM | POA: Insufficient documentation

## 2013-08-19 DIAGNOSIS — F17200 Nicotine dependence, unspecified, uncomplicated: Secondary | ICD-10-CM | POA: Insufficient documentation

## 2013-08-19 DIAGNOSIS — F319 Bipolar disorder, unspecified: Secondary | ICD-10-CM | POA: Insufficient documentation

## 2013-08-19 LAB — BASIC METABOLIC PANEL
BUN: 13 mg/dL (ref 6–23)
CHLORIDE: 103 meq/L (ref 96–112)
CO2: 27 mEq/L (ref 19–32)
Calcium: 8.7 mg/dL (ref 8.4–10.5)
Creatinine, Ser: 0.82 mg/dL (ref 0.50–1.10)
GFR calc Af Amer: >90 mL/min (ref >90)
GFR calc non Af Amer: 88 mL/min — ABNORMAL LOW (ref >90)
Glucose, Bld: 92 mg/dL (ref 70–99)
Potassium: 4.7 mEq/L (ref 3.7–5.3)
Sodium: 139 mEq/L (ref 137–147)

## 2013-08-19 LAB — URINALYSIS, ROUTINE W REFLEX MICROSCOPIC
Bilirubin Urine: NEGATIVE
Glucose, UA: NEGATIVE mg/dL
Ketones, ur: NEGATIVE mg/dL
Leukocytes, UA: NEGATIVE
Nitrite: NEGATIVE
Protein, ur: NEGATIVE mg/dL
Specific Gravity, Urine: 1.021 (ref 1.005–1.030)
Urobilinogen, UA: 0.2 mg/dL (ref 0.0–1.0)
pH: 6 (ref 5.0–8.0)

## 2013-08-19 LAB — CBC WITH DIFFERENTIAL/PLATELET
BASOS ABS: 0.1 10*3/uL (ref 0.0–0.1)
BASOS PCT: 1 % (ref 0–1)
Eosinophils Absolute: 0.3 10*3/uL (ref 0.0–0.7)
Eosinophils Relative: 3 % (ref 0–5)
HCT: 42 % (ref 36.0–46.0)
Hemoglobin: 14.5 g/dL (ref 12.0–15.0)
Lymphocytes Relative: 34 % (ref 12–46)
Lymphs Abs: 3 10*3/uL (ref 0.7–4.0)
MCH: 32.4 pg (ref 26.0–34.0)
MCHC: 34.5 g/dL (ref 30.0–36.0)
MCV: 94 fL (ref 78.0–100.0)
Monocytes Absolute: 0.6 10*3/uL (ref 0.1–1.0)
Monocytes Relative: 6 % (ref 3–12)
NEUTROS PCT: 56 % (ref 43–77)
Neutro Abs: 5 10*3/uL (ref 1.7–7.7)
Platelets: 252 10*3/uL (ref 150–400)
RBC: 4.47 MIL/uL (ref 3.87–5.11)
RDW: 12.5 % (ref 11.5–15.5)
WBC: 8.9 10*3/uL (ref 4.0–10.5)

## 2013-08-19 LAB — URINE MICROSCOPIC-ADD ON

## 2013-08-19 LAB — PREGNANCY, URINE: Preg Test, Ur: NEGATIVE

## 2013-08-19 MED ORDER — PANTOPRAZOLE SODIUM 40 MG IV SOLR
40.0000 mg | Freq: Once | INTRAVENOUS | Status: AC
  Administered 2013-08-19: 40 mg via INTRAVENOUS
  Filled 2013-08-19: qty 40

## 2013-08-19 MED ORDER — MORPHINE SULFATE 4 MG/ML IJ SOLN
6.0000 mg | Freq: Once | INTRAMUSCULAR | Status: AC
  Administered 2013-08-19: 6 mg via INTRAVENOUS
  Filled 2013-08-19: qty 2

## 2013-08-19 MED ORDER — ONDANSETRON HCL 4 MG/2ML IJ SOLN
4.0000 mg | Freq: Once | INTRAMUSCULAR | Status: AC
  Administered 2013-08-19: 4 mg via INTRAVENOUS
  Filled 2013-08-19: qty 2

## 2013-08-19 MED ORDER — GI COCKTAIL ~~LOC~~
30.0000 mL | Freq: Once | ORAL | Status: AC
  Administered 2013-08-19: 30 mL via ORAL
  Filled 2013-08-19: qty 30

## 2013-08-19 NOTE — ED Notes (Signed)
Patient is alert and oriented x3.  She is complaining of upper right quad pain that started 2 days ago.  Currently she rates her pain 8 of 10 with nausea.  Patient states she has a history of GI issues

## 2013-08-19 NOTE — ED Notes (Signed)
Pt presents with c/o abd pain  Pt states she has been diagnosed with peptic ulcer disorder  Pt states she has been unable to follow her treatment plan as a result of no insurance  Pt states for the past two days she has been having pain in her left side  Pt states when she eats it causes her to have nausea and vomiting  Pt states every morning she vomits whether she eats or not  Pt states she ends up in the ed every few months due to this

## 2013-08-19 NOTE — ED Provider Notes (Signed)
CSN: [id number]     Arrival date & time 08/19/13  1927 History   First MD Initiated Contact with Patient 08/19/13 2218     Chief Complaint  Patient presents with  . Abdominal Pain   (Consider location/radiation/quality/duration/timing/severity/associated sxs/prior Treatment) HPI Pt is a 43yo female with hx of diverticulitis, bipolar disorder, and MDD c/o left sided flank pain that started 2 days ago. Pain is constant, aching and stabbing, 8/10, radiating from under left lower rib around to left flank and back. Denies rash or lesion. States she has known PUD however does not have insurance to f/u with GI as recommended. Reports vomiting every morning with or without eating. Nothing makes symptoms better or worse. Does admit to taking ibuprofen as needed for headaches and menstrual cramps but reports not affect on symptoms. Denies fever. Denies urinary or vaginal symptoms. Reports loose stools but states that is chronic due to diverticulitis. Denies blood in stool or vomit.   Past Medical History  Diagnosis Date  . Bipolar 1 disorder   . Major depressive disorder   . Diverticulitis    Past Surgical History  Procedure Laterality Date  . Eye surgery  1985   Family History  Problem Relation Age of Onset  . Cancer Other   . CAD Other   . Emphysema Other    History  Substance Use Topics  . Smoking status: Current Every Day Smoker -- 0.50 packs/day    Types: Cigarettes  . Smokeless tobacco: Not on file     Comment: decrease smoking to 0.5 ppd  . Alcohol Use: No   OB History   Grav Para Term Preterm Abortions TAB SAB Ect Mult Living                 Review of Systems  Constitutional: Negative for fever and chills.  Respiratory: Negative for cough.   Cardiovascular: Negative for chest pain.  Gastrointestinal: Positive for nausea, vomiting and abdominal pain. Negative for diarrhea and constipation.  Genitourinary: Positive for flank pain ( left). Negative for dysuria, urgency,  frequency and pelvic pain.  Musculoskeletal: Negative for back pain.  Skin: Negative for rash.  All other systems reviewed and are negative.    Allergies  Codeine  Home Medications   Current Outpatient Rx  Name  Route  Sig  Dispense  Refill  . ibuprofen (ADVIL,MOTRIN) 200 MG tablet   Oral   Take 800 mg by mouth every 6 (six) hours as needed (pain).         . famotidine (PEPCID) 20 MG tablet   Oral   Take 1 tablet (20 mg total) by mouth 2 (two) times daily.   30 tablet   0   . ondansetron (ZOFRAN) 4 MG tablet   Oral   Take 1 tablet (4 mg total) by mouth every 6 (six) hours.   12 tablet   0   . traMADol (ULTRAM) 50 MG tablet   Oral   Take 1 tablet (50 mg total) by mouth every 6 (six) hours as needed.   15 tablet   0    BP 121/58  Pulse 65  Temp(Src) 97.9 F (36.6 C) (Oral)  Resp 22  SpO2 100%  LMP 08/18/2013 Physical Exam  Nursing note and vitals reviewed. Constitutional: She appears well-developed and well-nourished. No distress.  HENT:  Head: Normocephalic and atraumatic.  Eyes: Conjunctivae are normal. No scleral icterus.  Neck: Normal range of motion.  Cardiovascular: Normal rate, regular rhythm and normal heart sounds.  Pulmonary/Chest: Effort normal and breath sounds normal. No respiratory distress. She has no wheezes. She has no rales. She exhibits no tenderness.  Abdominal: Soft. Bowel sounds are normal. She exhibits no distension and no mass. There is tenderness ( left upper flank). There is no rebound and no guarding.  Soft, non-distended, tenderness in left upper flank. No masses, rebound or guarding.   Musculoskeletal: Normal range of motion.  Neurological: She is alert.  Skin: Skin is warm and dry. She is not diaphoretic. No erythema.    ED Course  Procedures (including critical care time) Labs Review Labs Reviewed  BASIC METABOLIC PANEL - Abnormal; Notable for the following:    GFR calc non Af Amer 88 (*)    All other components within  normal limits  URINALYSIS, ROUTINE W REFLEX MICROSCOPIC - Abnormal; Notable for the following:    APPearance CLOUDY (*)    Hgb urine dipstick TRACE (*)    All other components within normal limits  URINE MICROSCOPIC-ADD ON - Abnormal; Notable for the following:    Bacteria, UA MANY (*)    All other components within normal limits  CBC WITH DIFFERENTIAL  PREGNANCY, URINE   Imaging Review No results found.  EKG Interpretation   None       MDM   1. Left flank pain   2. Abdominal pain   3. Nausea & vomiting   4. PUD (peptic ulcer disease)    Pt with hx of abdominal pain and PUD c/o similar pain. Reports not having insurance, so she has been unable to f/u with GI or PCP.  Pt states she has had multiple negative CT scans, medical records support this. Pt appears uncomfortable. Abd: soft, non-distended, tenderness in LUQ and left flank. No masses, rebound or guarding. No lesions or rashes. Not concerned for surgical abdomen. Do not believe imaging needed at this time.  Tx in ED: GI cocktail, zofran, protonix, and morphine  Pt states she feels better and feels comfortable being discharged home. Advised to f/u with PCP.  Rx: pepcid, zofran, and tramadol. Return precautions provided. Pt verbalized understanding and agreement with tx plan.    Junius Finner, PA-C 08/20/13 [phone]

## 2013-08-20 MED ORDER — TRAMADOL HCL 50 MG PO TABS: 50.0000 mg | ORAL_TABLET | Freq: Four times a day (QID) | ORAL | Status: DC | PRN

## 2013-08-20 MED ORDER — FAMOTIDINE 20 MG PO TABS: 20.0000 mg | ORAL_TABLET | Freq: Two times a day (BID) | ORAL | Status: DC

## 2013-08-20 MED ORDER — ONDANSETRON HCL 4 MG PO TABS: 4.0000 mg | ORAL_TABLET | Freq: Four times a day (QID) | ORAL | Status: DC

## 2013-08-20 NOTE — Discharge Instructions (Signed)
Abdominal Pain, Adult  Many things can cause belly (abdominal) pain. Most times, the belly pain is not dangerous. Many cases of belly pain can be watched and treated at home.  HOME CARE   · Do not take medicines that help you go poop (laxatives) unless told to by your doctor.  · Only take medicine as told by your doctor.  · Eat or drink as told by your doctor. Your doctor will tell you if you should be on a special diet.  GET HELP IF:  · You do not know what is causing your belly pain.  · You have belly pain while you are sick to your stomach (nauseous) or have runny poop (diarrhea).  · You have pain while you pee or poop.  · Your belly pain wakes you up at night.  · You have belly pain that gets worse or better when you eat.  · You have belly pain that gets worse when you eat fatty foods.  GET HELP RIGHT AWAY IF:   · The pain does not go away within 2 hours.  · You have a fever.  · You keep throwing up (vomiting).  · The pain changes and is only in the right or left part of the belly.  · You have bloody or tarry looking poop.  MAKE SURE YOU:   · Understand these instructions.  · Will watch your condition.  · Will get help right away if you are not doing well or get worse.  Document Released: 01/04/2008 Document Revised: 05/08/2013 Document Reviewed: 03/27/2013  [employer]® Patient Information ©2014 [employer].

## 2013-08-20 NOTE — ED Notes (Signed)
Patient is alert and oriented x3.  She was given DC instructions and follow up visit instructions.  Patient gave verbal understanding. She was DC ambulatory under her own power to home.  V/S stable.  He was not showing any signs of distress on DC 

## 2013-08-21 NOTE — ED Provider Notes (Signed)
Medical screening examination/treatment/procedure(s) were performed by non-physician practitioner and as supervising physician I was immediately available for consultation/collaboration.  EKG Interpretation   None        Arra Connaughton R. Celsa Nordahl, MD 08/21/13 1456 

## 2013-12-07 ENCOUNTER — Encounter (HOSPITAL_COMMUNITY): Payer: Self-pay | Admitting: Emergency Medicine

## 2013-12-07 ENCOUNTER — Emergency Department (HOSPITAL_COMMUNITY)
Admission: EM | Admit: 2013-12-07 | Discharge: 2013-12-07 | Disposition: A | Discharge status: Home / Self Care | Payer: No Typology Code available for payment source | Attending: Emergency Medicine | Admitting: Emergency Medicine

## 2013-12-07 DIAGNOSIS — M549 Dorsalgia, unspecified: Secondary | ICD-10-CM

## 2013-12-07 DIAGNOSIS — Z8719 Personal history of other diseases of the digestive system: Secondary | ICD-10-CM | POA: Insufficient documentation

## 2013-12-07 DIAGNOSIS — Z8659 Personal history of other mental and behavioral disorders: Secondary | ICD-10-CM | POA: Insufficient documentation

## 2013-12-07 DIAGNOSIS — M5431 Sciatica, right side: Secondary | ICD-10-CM

## 2013-12-07 DIAGNOSIS — M5430 Sciatica, unspecified side: Secondary | ICD-10-CM | POA: Insufficient documentation

## 2013-12-07 DIAGNOSIS — F17200 Nicotine dependence, unspecified, uncomplicated: Secondary | ICD-10-CM | POA: Insufficient documentation

## 2013-12-07 DIAGNOSIS — M79609 Pain in unspecified limb: Secondary | ICD-10-CM | POA: Insufficient documentation

## 2013-12-07 MED ORDER — PREDNISONE 20 MG PO TABS: 40.0000 mg | ORAL_TABLET | Freq: Every day | ORAL | Status: DC

## 2013-12-07 MED ORDER — DIAZEPAM 5 MG/ML IJ SOLN
2.5000 mg | Freq: Once | INTRAMUSCULAR | Status: AC
  Administered 2013-12-07: 2.5 mg via INTRAMUSCULAR

## 2013-12-07 MED ORDER — MELOXICAM 7.5 MG PO TABS: 15.0000 mg | ORAL_TABLET | Freq: Every day | ORAL | Status: DC

## 2013-12-07 MED ORDER — KETOROLAC TROMETHAMINE 60 MG/2ML IM SOLN
60.0000 mg | Freq: Once | INTRAMUSCULAR | Status: AC
  Administered 2013-12-07: 60 mg via INTRAMUSCULAR
  Filled 2013-12-07: qty 2

## 2013-12-07 MED ORDER — HYDROCODONE-ACETAMINOPHEN 5-325 MG PO TABS: 1.0000 | ORAL_TABLET | Freq: Four times a day (QID) | ORAL | Status: DC | PRN

## 2013-12-07 MED ORDER — DIAZEPAM 5 MG/ML IJ SOLN
2.5000 mg | Freq: Once | INTRAMUSCULAR | Status: DC
  Filled 2013-12-07: qty 2

## 2013-12-07 MED ORDER — OXYCODONE-ACETAMINOPHEN 5-325 MG PO TABS
2.0000 | ORAL_TABLET | Freq: Once | ORAL | Status: AC
  Administered 2013-12-07: 2 via ORAL
  Filled 2013-12-07: qty 2

## 2013-12-07 NOTE — ED Provider Notes (Signed)
CSN: [id number]     Arrival date & time 12/07/13  [id number] History  This chart was scribed for non-physician practitioner working with Shanna CiscoMegan E Docherty, MD by Ashley JacobsBrittany Andrews, ED scribe. This patient was seen in room TR06C/TR06C and the patient's care was started at 9:49 AM.   First MD Initiated Contact with Patient 12/07/13 0913     Chief Complaint  Patient presents with  . Back Pain     (Consider location/radiation/quality/duration/timing/severity/associated sxs/prior Treatment) Patient is a 43 y.o. female presenting with back pain. The history is provided by the patient and medical records. No language interpreter was used.  Back Pain Location:  Lumbar spine Radiates to:  L posterior upper leg and R posterior upper leg Pain severity:  Moderate Timing:  Constant Progression:  Worsening Context: not recent injury   Relieved by:  Nothing Ineffective treatments:  Ibuprofen Associated symptoms: no fever and no numbness    HPI Comments: Vanessa Wood is a 43 y.o. female who presents to the Emergency Department complaining of constant moderate back pain, onset three day PTA. Denies injury. Pt a hx of degenerative disc disease but she reports this episode is more severe in nature.The pain radiates from her lower back down buttocks and her posterior legs. Pt walks really slow because of pain. Pt tried ibuprofen and a "pain pill" that her daughter gave her yesterday. Pt went to work yesterday despite of being in severe pain. She does heavy lifting on her job. Denies trauma/injury, loss of sensation of her leg and inability to walk. Denies fever. Denies urinary and bowel incontinence.    Past Medical History  Diagnosis Date  . Bipolar 1 disorder   . Major depressive disorder   . Diverticulitis    Past Surgical History  Procedure Laterality Date  . Eye surgery  1985   Family History  Problem Relation Age of Onset  . Cancer Other   . CAD Other   . Emphysema Other    History   Substance Use Topics  . Smoking status: Current Every Day Smoker -- 0.50 packs/day    Types: Cigarettes  . Smokeless tobacco: Not on file     Comment: decrease smoking to 0.5 ppd  . Alcohol Use: No   OB History   Grav Para Term Preterm Abortions TAB SAB Ect Mult Living                 Review of Systems  Constitutional: Negative for fever.  Gastrointestinal:       No bowel incontinence   Genitourinary:       No urinary incontinence   Musculoskeletal: Positive for back pain and myalgias.       Posterior right and left leg pain  Neurological: Negative for numbness.  All other systems reviewed and are negative.     Allergies  Codeine  Home Medications   Prior to Admission medications   Medication Sig Start Date End Date Taking? Authorizing Provider  ibuprofen (ADVIL,MOTRIN) 200 MG tablet Take 800 mg by mouth every 6 (six) hours as needed (pain).   Yes Historical Provider, MD   BP 116/78  Pulse 73  Temp(Src) 97.3 F (36.3 C) (Oral)  Resp 17  Ht 5\' 6"  (1.676 m)  Wt 191 lb (86.637 kg)  BMI 30.84 kg/m2  SpO2 96%  LMP 11/23/2013  Physical Exam  Nursing note and vitals reviewed. Constitutional: She is oriented to person, place, and time. She appears well-developed and well-nourished. No distress.  HENT:  Head:  Normocephalic and atraumatic.  Eyes: Conjunctivae and EOM are normal. No scleral icterus.  Neck: Normal range of motion.  Cardiovascular: Normal rate, regular rhythm and intact distal pulses.   Pulses:      Dorsalis pedis pulses are 2+ on the right side, and 2+ on the left side.       Posterior tibial pulses are 2+ on the right side, and 2+ on the left side.  Pulmonary/Chest: Effort normal. No respiratory distress.  Musculoskeletal: Normal range of motion.       Right hip: Normal.       Thoracic back: Normal.       Lumbar back: She exhibits tenderness. She exhibits normal range of motion, no bony tenderness, no edema, no deformity and no spasm.  Tenderness  to palpation of the right lumbosacral paraspinal muscles. No midline tenderness to the lumbosacral spine. No bony deformities or step-offs palpated. Negative straight leg raise.  Neurological: She is alert and oriented to person, place, and time. She has normal strength. No sensory deficit. GCS eye subscore is 4. GCS verbal subscore is 5. GCS motor subscore is 6.  Reflex Scores:      Patellar reflexes are 2+ on the right side.      Achilles reflexes are 2+ on the right side. Sensation to light touch intact. Patient ambulatory with normal gait.  Skin: Skin is warm and dry. No rash noted. She is not diaphoretic. No erythema. No pallor.  Psychiatric: She has a normal mood and affect. Her behavior is normal.    ED Course  Procedures (including critical care time) DIAGNOSTIC STUDIES: Oxygen Saturation is 96% on room air, normal by my interpretation.    COORDINATION OF CARE:  9:52 AM Discussed course of care with pt . Pt understands and agrees.  Labs Review Labs Reviewed - No data to display  Imaging Review No results found.   EKG Interpretation None      MDM   Final diagnoses:  Sciatica of right side  Back pain    Patient with back pain x 3 days. Hx of DDD, per patient. Patient neurovascularly intact on exam. No gross sensory deficits appreciated. Patient ambulates with normal gait. Patient can walk but states it is uncomfortable. No loss of bowel or bladder control. No concern for cauda equina. No history of trauma or injury to her back. RICE protocol and pain medicine indicated and discussed with patient. Will refer to orthopedics if symptoms persist. Return precautions provided and patient agreeable to plan with no unaddressed concerns.  I personally performed the services described in this documentation, which was scribed in my presence. The recorded information has been reviewed and is accurate.       Antony MaduraKelly Haruka Kowaleski, PA-C 12/07/13 1012

## 2013-12-07 NOTE — Discharge Instructions (Signed)
Recommend Mobic and prednisone as prescribed for symptoms. Ice your back 3-4 times per day for at least 30 minutes each time. You may take Norco as prescribed for severe pain. Followup with orthopedics if symptoms persist. Return if symptoms worsen such as if you develop fever, loss of your bowel or bladder function, or inability to walk.  Back Pain, Adult Low back pain is very common. About 1 in 5 people have back pain.The cause of low back pain is rarely dangerous. The pain often gets better over time.About half of people with a sudden onset of back pain feel better in just 2 weeks. About 8 in 10 people feel better by 6 weeks.  CAUSES Some common causes of back pain include:  Strain of the muscles or ligaments supporting the spine.  Wear and tear (degeneration) of the spinal discs.  Arthritis.  Direct injury to the back. DIAGNOSIS Most of the time, the direct cause of low back pain is not known.However, back pain can be treated effectively even when the exact cause of the pain is unknown.Answering your caregiver's questions about your overall health and symptoms is one of the most accurate ways to make sure the cause of your pain is not dangerous. If your caregiver needs more information, he or she may order lab work or imaging tests (X-rays or MRIs).However, even if imaging tests show changes in your back, this usually does not require surgery. HOME CARE INSTRUCTIONS For many people, back pain returns.Since low back pain is rarely dangerous, it is often a condition that people can learn to [facility] Institutemanageon their own.   Remain active. It is stressful on the back to sit or stand in one place. Do not sit, drive, or stand in one place for more than 30 minutes at a time. Take short walks on level surfaces as soon as pain allows.Try to increase the length of time you walk each day.  Do not stay in bed.Resting more than 1 or 2 days can delay your recovery.  Do not avoid exercise or work.Your  body is made to move.It is not dangerous to be active, even though your back may hurt.Your back will likely heal faster if you return to being active before your pain is gone.  Pay attention to your body when you bend and lift. Many people have less discomfortwhen lifting if they bend their knees, keep the load close to their bodies,and avoid twisting. Often, the most comfortable positions are those that put less stress on your recovering back.  Find a comfortable position to sleep. Use a firm mattress and lie on your side with your knees slightly bent. If you lie on your back, put a pillow under your knees.  Only take over-the-counter or prescription medicines as directed by your caregiver. Over-the-counter medicines to reduce pain and inflammation are often the most helpful.Your caregiver may prescribe muscle relaxant drugs.These medicines help dull your pain so you can more quickly return to your normal activities and healthy exercise.  Put ice on the injured area.  Put ice in a plastic bag.  Place a towel between your skin and the bag.  Leave the ice on for 15-20 minutes, 03-04 times a day for the first 2 to 3 days. After that, ice and heat may be alternated to reduce pain and spasms.  Ask your caregiver about trying back exercises and gentle massage. This may be of some benefit.  Avoid feeling anxious or stressed.Stress increases muscle tension and can worsen back pain.It is  important to recognize when you are anxious or stressed and learn ways to manage it.Exercise is a great option. SEEK MEDICAL CARE IF:  You have pain that is not relieved with rest or medicine.  You have pain that does not improve in 1 week.  You have new symptoms.  You are generally not feeling well. SEEK IMMEDIATE MEDICAL CARE IF:   You have pain that radiates from your back into your legs.  You develop new bowel or bladder control problems.  You have unusual weakness or numbness in your arms or  legs.  You develop nausea or vomiting.  You develop abdominal pain.  You feel faint. Document Released: 07/18/2005 Document Revised: 01/17/2012 Document Reviewed: 12/06/2010 [facility] Patient Information 2014 [place], [place]. Sciatica Sciatica is pain, weakness, numbness, or tingling along your sciatic nerve. The nerve starts in the lower back and runs down the back of each leg. Nerve damage or certain conditions pinch or put pressure on the sciatic nerve. This causes the pain, weakness, and other discomforts of sciatica. HOME CARE   Only take medicine as told by your doctor.  Apply ice to the affected area for 20 minutes. Do this 3 4 times a day for the first 48 72 hours. Then try heat in the same way.  Exercise, stretch, or do your usual activities if these do not make your pain worse.  Go to physical therapy as told by your doctor.  Keep all doctor visits as told.  Do not wear high heels or shoes that are not supportive.  Get a firm mattress if your mattress is too soft to lessen pain and discomfort. GET HELP RIGHT AWAY IF:   You cannot control when you poop (bowel movement) or pee (urinate).  You have more weakness in your lower back, lower belly (pelvis), butt (buttocks), or legs.  You have redness or puffiness (swelling) of your back.  You have a burning feeling when you pee.  You have pain that gets worse when you lie down.  You have pain that wakes you from your sleep.  Your pain is worse than past pain.  Your pain lasts longer than 4 weeks.  You are suddenly losing weight without reason. MAKE SURE YOU:   Understand these instructions.  Will watch this condition.  Will get help right away if you are not doing well or get worse. Document Released: 04/26/2008 Document Revised: 01/17/2012 Document Reviewed: 11/27/2011 [facility] Patient Information 2014 [place], [place].

## 2013-12-07 NOTE — ED Provider Notes (Signed)
Medical screening examination/treatment/procedure(s) were performed by non-physician practitioner and as supervising physician I was immediately available for consultation/collaboration.   EKG Interpretation None        Shanna CiscoMegan E Lurlean Kernen, MD 12/07/13 2052

## 2013-12-07 NOTE — ED Notes (Signed)
Pt. Stated, I've had back pain down in my buttocks for 3 days and i cant take the pain anymore

## 2014-03-13 ENCOUNTER — Encounter (HOSPITAL_COMMUNITY): Payer: Self-pay | Admitting: Emergency Medicine

## 2014-03-13 ENCOUNTER — Emergency Department (HOSPITAL_COMMUNITY)
Admission: EM | Admit: 2014-03-13 | Discharge: 2014-03-13 | Disposition: A | Discharge status: Home / Self Care | Payer: No Typology Code available for payment source | Attending: Emergency Medicine | Admitting: Emergency Medicine

## 2014-03-13 DIAGNOSIS — Z8659 Personal history of other mental and behavioral disorders: Secondary | ICD-10-CM | POA: Insufficient documentation

## 2014-03-13 DIAGNOSIS — F17200 Nicotine dependence, unspecified, uncomplicated: Secondary | ICD-10-CM | POA: Insufficient documentation

## 2014-03-13 DIAGNOSIS — Z791 Long term (current) use of non-steroidal anti-inflammatories (NSAID): Secondary | ICD-10-CM | POA: Insufficient documentation

## 2014-03-13 DIAGNOSIS — M549 Dorsalgia, unspecified: Secondary | ICD-10-CM

## 2014-03-13 DIAGNOSIS — M545 Low back pain, unspecified: Secondary | ICD-10-CM | POA: Insufficient documentation

## 2014-03-13 DIAGNOSIS — Z8719 Personal history of other diseases of the digestive system: Secondary | ICD-10-CM | POA: Insufficient documentation

## 2014-03-13 MED ORDER — PREDNISONE 20 MG PO TABS: 40.0000 mg | ORAL_TABLET | Freq: Every day | ORAL | Status: DC

## 2014-03-13 MED ORDER — OXYCODONE-ACETAMINOPHEN 5-325 MG PO TABS: 1.0000 | ORAL_TABLET | ORAL | Status: DC | PRN

## 2014-03-13 NOTE — ED Notes (Signed)
Pt states she woke with LBP radiating to left buttock and leg x 1 week ago.

## 2014-03-13 NOTE — ED Notes (Signed)
Pt reports having back pain that's radiating into L leg; has tried home remedies without relief; denies incontinence

## 2014-03-13 NOTE — ED Provider Notes (Signed)
CSN: [id number]     Arrival date & time 03/13/14  1055 History  This chart was scribed for non-physician practitioner Sharilyn SitesLisa Haniah Penny, PA-C working with Audree CamelScott T Goldston, MD by Leone PayorSonum Patel, ED Scribe. This patient was seen in room TR05C/TR05C and the patient's care was started at 11:58 AM.    Chief Complaint  Patient presents with  . Back Pain    The history is provided by the patient. No language interpreter was used.    HPI Comments: Vanessa Wood is a 43 y.o. female who presents to the Emergency Department complaining of 1 week of constant, unchanged left lower back pain that radiates down the entire left posterior leg. She states the pain is worse with standing. She denies recent injuries, falls, or strains. She reports similar symptoms a long time ago but is unsure how it resolved. She has tried Aleve, ibuprofen, "nerve cream", ice without relief. She denies paresthesias, numbness, weakness, bowel or bladder incontinence. She denies history back surgery. No hx IVDU or cancer.  Past Medical History  Diagnosis Date  . Bipolar 1 disorder   . Major depressive disorder   . Diverticulitis    Past Surgical History  Procedure Laterality Date  . Eye surgery  1985   Family History  Problem Relation Age of Onset  . Cancer Other   . CAD Other   . Emphysema Other    History  Substance Use Topics  . Smoking status: Current Every Day Smoker -- 0.50 packs/day    Types: Cigarettes  . Smokeless tobacco: Not on file     Comment: decrease smoking to 0.5 ppd  . Alcohol Use: No   OB History   Grav Para Term Preterm Abortions TAB SAB Ect Mult Living                 Review of Systems  Musculoskeletal: Positive for back pain.  Neurological: Negative for weakness and numbness.  All other systems reviewed and are negative.     Allergies  Codeine  Home Medications   Prior to Admission medications   Medication Sig Start Date End Date Taking? Authorizing Provider   HYDROcodone-acetaminophen (NORCO/VICODIN) 5-325 MG per tablet Take 1-2 tablets by mouth every 6 (six) hours as needed. 12/07/13   Antony MaduraKelly Humes, PA-C  ibuprofen (ADVIL,MOTRIN) 200 MG tablet Take 800 mg by mouth every 6 (six) hours as needed (pain).    Historical Provider, MD  meloxicam (MOBIC) 7.5 MG tablet Take 2 tablets (15 mg total) by mouth daily. 12/07/13   Antony MaduraKelly Humes, PA-C  predniSONE (DELTASONE) 20 MG tablet Take 2 tablets (40 mg total) by mouth daily. 12/07/13   Antony MaduraKelly Humes, PA-C   BP 136/84  Pulse 67  Temp(Src) 97.8 F (36.6 C) (Oral)  Resp 20  Ht 5\' 4"  (1.626 m)  Wt 170 lb (77.111 kg)  BMI 29.17 kg/m2  SpO2 97%  LMP 02/10/2014  Physical Exam  Nursing note and vitals reviewed. Constitutional: She is oriented to person, place, and time. She appears well-developed and well-nourished.  HENT:  Head: Normocephalic and atraumatic.  Mouth/Throat: Oropharynx is clear and moist.  Eyes: Conjunctivae and EOM are normal. Pupils are equal, round, and reactive to light.  Neck: Normal range of motion.  Cardiovascular: Normal rate, regular rhythm and normal heart sounds.   Pulmonary/Chest: Effort normal and breath sounds normal.  Abdominal: Soft. Bowel sounds are normal.  Musculoskeletal: Normal range of motion.  Endorses pain of left SI joint without focal tenderness or deformities. Negative  SLR on left. Legs remain NVI with normal strength throughout. Ambulating unassisted without difficulty.  Neurological: She is alert and oriented to person, place, and time.  Skin: Skin is warm and dry.  Psychiatric: She has a normal mood and affect.    ED Course  Procedures (including critical care time)  DIAGNOSTIC STUDIES: Oxygen Saturation is 97% on RA, adequate by my interpretation.    COORDINATION OF CARE: 12:00 PM Discussed treatment plan with pt at bedside and pt agreed to plan.   Labs Review Labs Reviewed - No data to display  Imaging Review No results found.   EKG  Interpretation None      MDM   Final diagnoses:  Back pain, unspecified location   Atraumatic low back pain, likely sciatica vs lumbar radiculopathy given nature of sx.  No red flag sx on exam to suggest cauda equina, SCI, or other serious pathology.  Legs remains NVI.  Patient discharged home with percocet and prednisone.  Will FU with [facility] if problems occur.  Discussed plan with patient, he/she acknowledged understanding and agreed with plan of care.  Return precautions given for new or worsening symptoms.  I personally performed the services described in this documentation, which was scribed in my presence. The recorded information has been reviewed and is accurate.  Garlon Hatchet, PA-C 03/13/14 1308

## 2014-03-13 NOTE — Discharge Instructions (Signed)
Take the prescribed medication as directed. Follow-up with [facility] if problems occur. Return to the ED for new or worsening symptoms.

## 2014-03-14 NOTE — ED Provider Notes (Signed)
Medical screening examination/treatment/procedure(s) were performed by non-physician practitioner and as supervising physician I was immediately available for consultation/collaboration.  Shalisa Mcquade T Harvard Zeiss, MD 03/14/14 1521 

## 2014-05-21 ENCOUNTER — Emergency Department (HOSPITAL_COMMUNITY)
Admission: EM | Admit: 2014-05-21 | Discharge: 2014-05-21 | Disposition: A | Discharge status: Home / Self Care | Payer: No Typology Code available for payment source | Attending: Emergency Medicine | Admitting: Emergency Medicine

## 2014-05-21 ENCOUNTER — Encounter (HOSPITAL_COMMUNITY): Payer: Self-pay | Admitting: Emergency Medicine

## 2014-05-21 DIAGNOSIS — Z8659 Personal history of other mental and behavioral disorders: Secondary | ICD-10-CM | POA: Insufficient documentation

## 2014-05-21 DIAGNOSIS — Z7952 Long term (current) use of systemic steroids: Secondary | ICD-10-CM | POA: Insufficient documentation

## 2014-05-21 DIAGNOSIS — Z791 Long term (current) use of non-steroidal anti-inflammatories (NSAID): Secondary | ICD-10-CM | POA: Insufficient documentation

## 2014-05-21 DIAGNOSIS — Z720 Tobacco use: Secondary | ICD-10-CM | POA: Insufficient documentation

## 2014-05-21 DIAGNOSIS — Z8719 Personal history of other diseases of the digestive system: Secondary | ICD-10-CM | POA: Insufficient documentation

## 2014-05-21 DIAGNOSIS — M5442 Lumbago with sciatica, left side: Secondary | ICD-10-CM

## 2014-05-21 MED ORDER — PREDNISONE 20 MG PO TABS: 40.0000 mg | ORAL_TABLET | Freq: Every day | ORAL | Status: DC

## 2014-05-21 MED ORDER — OXYCODONE-ACETAMINOPHEN 5-325 MG PO TABS: 1.0000 | ORAL_TABLET | Freq: Four times a day (QID) | ORAL | Status: DC | PRN

## 2014-05-21 NOTE — ED Provider Notes (Signed)
CSN: [id number]     Arrival date & time 05/21/14  0940 History  This chart was scribed for non-physician practitioner, Clabe SealLauren M Phuoc Huy, PA-C, working with Derwood KaplanAnkit Nanavati, MD by Charline BillsEssence Howell, ED Scribe. This patient was seen in room TR10C/TR10C and the patient's care was started at 11:58 AM.   Chief Complaint  Patient presents with  . Back Pain   HPI Comments: Vanessa Wood is a 43 y.o. female who presents to the Emergency Department complaining of recurrent L lower back pain that radiates into L posterior leg, worsened over the past 2 weeks. Pain is exacerbated with movement. This is a recurrent problem; similar pain 2 months ago. She denies fall or injury. She has tried ice, heat, warm baths, stretching and 800 mg ibuprofen x3 daily with temporary relief. She has not seen a back specialist. No PCP.   The history is provided by the patient. No language interpreter was used.    Past Medical History  Diagnosis Date  . Bipolar 1 disorder   . Major depressive disorder   . Diverticulitis    Past Surgical History  Procedure Laterality Date  . Eye surgery  1985   Family History  Problem Relation Age of Onset  . Cancer Other   . CAD Other   . Emphysema Other    History  Substance Use Topics  . Smoking status: Current Every Day Smoker -- 0.50 packs/day    Types: Cigarettes  . Smokeless tobacco: Not on file     Comment: decrease smoking to 0.5 ppd  . Alcohol Use: No   OB History   Grav Para Term Preterm Abortions TAB SAB Ect Mult Living                 Review of Systems  Constitutional: Negative for fever and chills.  Gastrointestinal: Negative for abdominal pain.  Genitourinary: Negative for dysuria, urgency and hematuria.  Musculoskeletal: Positive for back pain.  Skin: Negative for wound.  All other systems reviewed and are negative.  Allergies  Codeine  Home Medications   Prior to Admission medications   Medication Sig Start Date End Date Taking? Authorizing  Provider  HYDROcodone-acetaminophen (NORCO/VICODIN) 5-325 MG per tablet Take 1-2 tablets by mouth every 6 (six) hours as needed. 12/07/13   Antony MaduraKelly Humes, PA-C  ibuprofen (ADVIL,MOTRIN) 200 MG tablet Take 800 mg by mouth every 6 (six) hours as needed (pain).    Historical Provider, MD  meloxicam (MOBIC) 7.5 MG tablet Take 2 tablets (15 mg total) by mouth daily. 12/07/13   Antony MaduraKelly Humes, PA-C  oxyCODONE-acetaminophen (PERCOCET/ROXICET) 5-325 MG per tablet Take 1 tablet by mouth every 4 (four) hours as needed. 03/13/14   Garlon HatchetLisa M Sanders, PA-C  predniSONE (DELTASONE) 20 MG tablet Take 2 tablets (40 mg total) by mouth daily. 12/07/13   Antony MaduraKelly Humes, PA-C  predniSONE (DELTASONE) 20 MG tablet Take 2 tablets (40 mg total) by mouth daily. Take 40 mg by mouth daily for 3 days, then 20mg  by mouth daily for 3 days, then 10mg  daily for 3 days 03/13/14   Garlon HatchetLisa M Sanders, PA-C   Triage Vitals: BP 108/60  Pulse 82  Temp(Src) 97.8 F (36.6 C) (Oral)  Resp 22  SpO2 97% Physical Exam  Nursing note and vitals reviewed. Constitutional: She is oriented to person, place, and time. She appears well-developed and well-nourished. No distress.  HENT:  Head: Normocephalic and atraumatic.  Eyes: Conjunctivae and EOM are normal.  Neck: Neck supple.  Pulmonary/Chest: Effort normal. No  respiratory distress.  Musculoskeletal: Normal range of motion.       Lumbar back: She exhibits tenderness. She exhibits normal range of motion.       Back:  Palpation of left SI joint recreates discomfort.  Neurological: She is alert and oriented to person, place, and time.  Skin: Skin is warm and dry.  Psychiatric: She has a normal mood and affect. Her behavior is normal.   ED Course  Procedures (including critical care time) DIAGNOSTIC STUDIES: Oxygen Saturation is 97% on RA, normal by my interpretation.    COORDINATION OF CARE: 12:01 PM-Discussed treatment plan which includes Prednisone and Percocet with pt at bedside and pt agreed to  plan.   Labs Review Labs Reviewed - No data to display  Imaging Review No results found.   EKG Interpretation None      MDM   Final diagnoses:  Left-sided low back pain with left-sided sciatica   Patient with back pain.  Discomfort reproduced with palpation of left SI joint, able to ambulate without assistance.  Patient can walk but states is painful.  No loss of bowel or bladder control.  No concern for cauda equina.  No fever, night sweats, weight loss, h/o cancer, IVDU.  RICE protocol and pain medicine indicated and discussed with patient.  EMR shows patient has had similar symptoms in the past and has had moderate resolution of symptoms with prednisone and narcotic pain medication, will prescribe a short course of prednisone denies history of diabetes. Meds given in ED:  Medications - No data to display  Discharge Medication List as of 05/21/2014 12:11 PM    START taking these medications   Details  !! oxyCODONE-acetaminophen (PERCOCET) 5-325 MG per tablet Take 1 tablet by mouth every 6 (six) hours as needed., Starting 05/21/2014, Until Discontinued, Print    !! predniSONE (DELTASONE) 20 MG tablet Take 2 tablets (40 mg total) by mouth daily. Take 40 mg by mouth daily for 3 days, then 20mg  by mouth daily for 3 days, then 10mg  daily for 3 days, Starting 05/21/2014, Until Discontinued, Print     !! - Potential duplicate medications found. Please discuss with provider.     I personally performed the services described in this documentation, which was scribed in my presence. The recorded information has been reviewed and is accurate.    Mellody DrownLauren Jianna Drabik, PA-C 05/21/14 [phone]

## 2014-05-21 NOTE — ED Notes (Signed)
Pt here for left lower back pain radiating down left leg.

## 2014-05-21 NOTE — Discharge Instructions (Signed)
Call for a follow up appointment with a Family or Primary Care Provider.  Return if Symptoms worsen.   Take medication as prescribed.  Do not operate machinery, drink alcohol while taking narcotic pain medication.   Emergency Department Resource Guide 1) Find a Doctor and Pay Out of Pocket Although you won't have to find out who is covered by your insurance plan, it is a good idea to ask around and get recommendations. You will then need to call the office and see if the doctor you have chosen will accept you as a new patient and what types of options they offer for patients who are self-pay. Some doctors offer discounts or will set up payment plans for their patients who do not have insurance, but you will need to ask so you aren't surprised when you get to your appointment.  2) Contact Your Local Health Department Not all health departments have doctors that can see patients for sick visits, but many do, so it is worth a call to see if yours does. If you don't know where your local health department is, you can check in your phone book. The CDC also has a tool to help you locate your state's health department, and many state websites also have listings of all of their local health departments.  3) Find a Walk-in Clinic If your illness is not likely to be very severe or complicated, you may want to try a walk in clinic. These are popping up all over the country in pharmacies, drugstores, and shopping centers. They're usually staffed by nurse practitioners or physician assistants that have been trained to treat common illnesses and complaints. They're usually fairly quick and inexpensive. However, if you have serious medical issues or chronic medical problems, these are probably not your best option.  No Primary Care Doctor: - Call Health Connect at  [phone] - they can help you locate a primary care doctor that  accepts your insurance, provides certain services, etc. - Physician Referral Service-  [phone]  Chronic Pain Problems: Organization         Address  Phone   Notes  [facility]  [phone] Patients need to be referred by their primary care doctor.   Medication Assistance: Organization         Address  Phone   Notes  [facility] [address] [place], [place] [address] [phone] --Must be a resident of [facility] -- Must have NO insurance coverage whatsoever (no Medicaid/ Medicare, etc.) -- The pt. MUST have a primary care doctor that directs their care regularly and follows them in the community   MedAssist  [phone]   [employer]  [phone]    Agencies that provide inexpensive medical care: Organization         Address  Phone   Notes  [facility]  [phone]) [phone]   [facility]    [phone]   [facility] [address] [place], [place] [address] [phone]   [facility] [address], [place] [phone]) [phone]   Planned Parenthood    [phone]   [facility]    [phone]   Community Health and [facility]  [address], [place] Phone:  [phone]) [phone], Fax:  [phone]) [phone] Hours of Operation:  9 am - 6 pm, M-F.  Also accepts Medicaid/Medicare and self-pay.  [place]  Center for [place], [address], [place] Phone: [phone], Fax: [phone]. Hours of Operation:  8:30 am - 5:30 pm, M-F.  Also accepts Medicaid and self-pay.  [facility] [place] [address] [place] Phone: [phone]   [place], [place], [place] [phone], Ext. [phone] Mondays & Thursdays: 7-9 AM.  First 15 patients are seen on a first come, first serve basis.    [place] Providers:  Organization         Address  Phone   Notes  [facility] [address],  [place] [phone] Also accepts self-pay patients.  [facility] [id number] [place], [place]  [phone]   [place], [address], [place] [phone]   [facility] [address] [place] [phone]   Lucianne Lei [address], [place]   [phone] Only accepts [place] Access [place] patients after they have their name applied to their card.   Self-Pay (no insurance) in [facility]:  Organization         Address  Phone   Notes  Sickle Cell Patients, [facility] [place] [phone]   [facility] [place] [phone]   [facility] [place]  [place], [place], [place] [phone]   [facility]/Dr. Osei-Bonsu  [address], [place] or [address], [place] [phone] Phone number for both [place] and [place] locations is the same.  [facility]gent [facility] [address] [place] [phone]   [facility] [place] [address], [place] or [address] [phone]   [facility] [address], [place] [phone], phone; [phone], fax Sees patients 1st and 3rd Saturday of every month.  Must not qualify for public or private insurance (i.e. Medicaid, Medicare, [facility] Health Choice, Veterans' Benefits)  Household income should be no more than 200% of the poverty level The clinic cannot treat you if you are pregnant or think you are pregnant  Sexually transmitted diseases are not treated at the clinic.    Dental Care: Organization         Address  Phone  Notes  [facility] Department of [facility] [place] [phone] Accepts children up to age 102 who are enrolled in  [place] or [place]; pregnant women with a Medicaid card; and children who have applied for Medicaid or [place] Health Choice, but were declined, whose parents can pay a reduced fee at time of service.  [facility] Department of [facility]  [address], [place] [phone] Accepts children up to age 74 who are enrolled in [place] or [place]; pregnant women with a Medicaid card; and children who have applied for Medicaid or [place] Health Choice, but were declined, whose parents can pay a reduced fee at time of service.  [facility]  [place] [phone] Patients are seen by appointment only. Walk-ins are not accepted. [place] will see patients 40 years of age and older. Monday - Tuesday (8am-5pm) Most Wednesdays (8:30-5pm) $30 per visit, cash  only  [facility] Access PROGRAM  [address], [facility] [phone] Patients are seen by appointment only. Walk-ins are not accepted. [place] will see patients 14 years of age and older. One Wednesday Evening (Monthly: Volunteer Based).  $30 per visit, cash only  [place]  [phone] for adults; Children under age 48, call Graduate Pediatric Dentistry at [phone]. Children aged 39-14, please call [phone] to request a pediatric application.  Dental services are provided in all areas of dental care including fillings, crowns and bridges, complete and partial dentures, implants, gum treatment, root canals, and extractions. Preventive care is also provided. Treatment is provided to both adults and children. Patients are selected via a lottery and there is often a waiting list.   [facility] [address] [place]  [phone] [email or web address]   [facility] [address] [place], [place] [phone], Ext. [phone] Second and Fourth Thursday of each month, opens at 6:30  AM; Clinic ends at 9 AM.  Patients are seen on a first-come first-served basis, and a limited number are seen during each clinic.   [facility]  [address] Hillard Danker [place], [place] [phone]   Eligibility Requirements You must have lived in [place], [place], or [place] counties for at least the last three months.   You cannot be eligible for state or federal sponsored [employer], including [employer], [place], or [employer].   You generally cannot be eligible for healthcare insurance through your employer.    How to apply: Eligibility screenings are held every Tuesday and Wednesday afternoon from 1:00 pm until 4:00 pm. You do not need an appointment for the interview!  [facility] [address], [place], [place]   [place]  [place] Department  [place]  [phone]    Behavioral Health Resources in the Community: Intensive Outpatient Programs Organization         Address  Phone  Notes  [place]. [address], [place], [place] [phone]   [facility] Outpatient [address] [place], [place]   ADS: Alcohol & Drug Svcs [address], [place], [place]   [place] [address],  [place], [place] or [phone]   Substance Abuse Resources Organization         Address  Phone  Notes  Alcohol and Drug Services  [phone]   [place]  [phone]   The [place]   Chinita Pester  [phone]   Residential & Outpatient Substance Abuse Program  [phone]   Psychological Services Organization         Address  Phone  Notes  [facility] [place]  [place]  [phone]   [place] [address], [place] or  [phone]    Mobile Crisis Teams Organization         Address  Phone  Notes  [facility], Mobile Crisis Care Unit  [phone]   [facility]  [address] [place], [place]   [place] Levels [address] [place] [phone]    Self-Help/Support Groups Organization         Address  Phone  Notes  [facility] of [place] - variety of support groups  [place] Call for more information  Narcotics Anonymous (NA), [facility]aring [facility] [address], [employer] [place]  2 meetings at this location   Special educational needs teacher         Address  Phone  Notes  ASAP Residential Treatment [place],    [place]  [phone]   [facility]  [address] [place] [id number], [place], [place]   [facility] [place], [place] [phone] Admissions: 8am-3pm M-F  Incentives Substance [place] [address]    [place], [place] [phone]   [facility] [address] [place], [place], [place]   [facility] [address],  [place], [place]   [facility]ight Programs - Intensive Outpatient [address] Kristeen Mans [address], [place], [place]   [facility] ([place] [address].,  [place], [place] [phone] or [phone]   [facility] (RTS) [address], [place], [place] Accepts Medicaid  [facility] [place] [address]  [place] [phone] Substance Abuse/Addiction Treatment   [facility] Organization         Address  Phone  Notes  [facility]  [phone]   Domenic Schwab, PhD [address] Arlis Porta [place], [place]   [phone] or [phone]   [place], [place] [phone]   [facility] [address],  [place], [place] [phone] Insurance/Medicaid/sponsorship through [facility] and Families [address] [place]                                    [place], [place] [phone] [place] [address] [place], [place] [phone]    Dr. Adele Schilder  [phone]   [facility] Dept. 1) [address] [place] 2) [place] 3)  [place], [place] [phone]  [phone]   [place] [phone] or [phone] (After Hours)

## 2014-05-22 NOTE — ED Provider Notes (Signed)
Medical screening examination/treatment/procedure(s) were performed by non-physician practitioner and as supervising physician I was immediately available for consultation/collaboration.   EKG Interpretation None       Derwood KaplanAnkit Saryiah Bencosme, MD 05/22/14 [phone]

## 2014-07-06 ENCOUNTER — Encounter (HOSPITAL_COMMUNITY): Payer: Self-pay | Admitting: *Deleted

## 2014-07-06 ENCOUNTER — Emergency Department (HOSPITAL_COMMUNITY)
Admission: EM | Admit: 2014-07-06 | Discharge: 2014-07-06 | Disposition: A | Discharge status: Home / Self Care | Payer: No Typology Code available for payment source | Attending: Emergency Medicine | Admitting: Emergency Medicine

## 2014-07-06 DIAGNOSIS — R102 Pelvic and perineal pain: Secondary | ICD-10-CM | POA: Insufficient documentation

## 2014-07-06 DIAGNOSIS — Z7952 Long term (current) use of systemic steroids: Secondary | ICD-10-CM | POA: Insufficient documentation

## 2014-07-06 DIAGNOSIS — Z720 Tobacco use: Secondary | ICD-10-CM | POA: Insufficient documentation

## 2014-07-06 DIAGNOSIS — Z791 Long term (current) use of non-steroidal anti-inflammatories (NSAID): Secondary | ICD-10-CM | POA: Insufficient documentation

## 2014-07-06 DIAGNOSIS — R61 Generalized hyperhidrosis: Secondary | ICD-10-CM | POA: Insufficient documentation

## 2014-07-06 DIAGNOSIS — Z3202 Encounter for pregnancy test, result negative: Secondary | ICD-10-CM | POA: Insufficient documentation

## 2014-07-06 DIAGNOSIS — R5383 Other fatigue: Secondary | ICD-10-CM | POA: Insufficient documentation

## 2014-07-06 DIAGNOSIS — Z8659 Personal history of other mental and behavioral disorders: Secondary | ICD-10-CM | POA: Insufficient documentation

## 2014-07-06 DIAGNOSIS — N898 Other specified noninflammatory disorders of vagina: Secondary | ICD-10-CM | POA: Insufficient documentation

## 2014-07-06 DIAGNOSIS — Z8719 Personal history of other diseases of the digestive system: Secondary | ICD-10-CM | POA: Insufficient documentation

## 2014-07-06 LAB — CBC WITH DIFFERENTIAL/PLATELET
Basophils Absolute: 0 10*3/uL (ref 0.0–0.1)
Basophils Relative: 0 % (ref 0–1)
Eosinophils Absolute: 0.2 10*3/uL (ref 0.0–0.7)
Eosinophils Relative: 2 % (ref 0–5)
HCT: 39.6 % (ref 36.0–46.0)
Hemoglobin: 13.4 g/dL (ref 12.0–15.0)
Lymphocytes Relative: 30 % (ref 12–46)
Lymphs Abs: 2.7 10*3/uL (ref 0.7–4.0)
MCH: 31.4 pg (ref 26.0–34.0)
MCHC: 33.8 g/dL (ref 30.0–36.0)
MCV: 92.7 fL (ref 78.0–100.0)
Monocytes Absolute: 0.5 10*3/uL (ref 0.1–1.0)
Monocytes Relative: 6 % (ref 3–12)
Neutro Abs: 5.4 10*3/uL (ref 1.7–7.7)
Neutrophils Relative %: 61 % (ref 43–77)
Platelets: 223 10*3/uL (ref 150–400)
RBC: 4.27 MIL/uL (ref 3.87–5.11)
RDW: 12.5 % (ref 11.5–15.5)
WBC: 8.9 10*3/uL (ref 4.0–10.5)

## 2014-07-06 LAB — COMPREHENSIVE METABOLIC PANEL
ALT: 8 U/L (ref 0–35)
AST: 15 U/L (ref 0–37)
Albumin: 3.2 g/dL — ABNORMAL LOW (ref 3.5–5.2)
Alkaline Phosphatase: 59 U/L (ref 39–117)
Anion gap: 11 (ref 5–15)
BUN: 7 mg/dL (ref 6–23)
CO2: 24 mEq/L (ref 19–32)
Calcium: 9.1 mg/dL (ref 8.4–10.5)
Chloride: 103 mEq/L (ref 96–112)
Creatinine, Ser: 0.67 mg/dL (ref 0.50–1.10)
GFR calc Af Amer: >90 mL/min (ref >90)
GFR calc non Af Amer: >90 mL/min (ref >90)
Glucose, Bld: 92 mg/dL (ref 70–99)
Potassium: 4.1 mEq/L (ref 3.7–5.3)
Sodium: 138 mEq/L (ref 137–147)
Total Bilirubin: 0.2 mg/dL — ABNORMAL LOW (ref 0.3–1.2)
Total Protein: 6.6 g/dL (ref 6.0–8.3)

## 2014-07-06 LAB — URINALYSIS, ROUTINE W REFLEX MICROSCOPIC
Bilirubin Urine: NEGATIVE
Glucose, UA: NEGATIVE mg/dL
Hgb urine dipstick: NEGATIVE
Ketones, ur: NEGATIVE mg/dL
Leukocytes, UA: NEGATIVE
Nitrite: NEGATIVE
Protein, ur: NEGATIVE mg/dL
Specific Gravity, Urine: 1.013 (ref 1.005–1.030)
Urobilinogen, UA: 0.2 mg/dL (ref 0.0–1.0)
pH: 8 (ref 5.0–8.0)

## 2014-07-06 LAB — PREGNANCY, URINE: Preg Test, Ur: NEGATIVE

## 2014-07-06 LAB — WET PREP, GENITAL
Clue Cells Wet Prep HPF POC: NONE SEEN
Trich, Wet Prep: NONE SEEN
Yeast Wet Prep HPF POC: NONE SEEN

## 2014-07-06 LAB — RAPID HIV SCREEN (WH-MAU): Rapid HIV Screen: NONREACTIVE

## 2014-07-06 LAB — RPR

## 2014-07-06 MED ORDER — HYDROCODONE-ACETAMINOPHEN 5-325 MG PO TABS: 1.0000 | ORAL_TABLET | Freq: Four times a day (QID) | ORAL | Status: DC | PRN

## 2014-07-06 MED ORDER — OXYCODONE-ACETAMINOPHEN 5-325 MG PO TABS
1.0000 | ORAL_TABLET | Freq: Once | ORAL | Status: AC
  Administered 2014-07-06: 1 via ORAL
  Filled 2014-07-06: qty 1

## 2014-07-06 MED ORDER — KETOROLAC TROMETHAMINE 30 MG/ML IJ SOLN
30.0000 mg | Freq: Once | INTRAMUSCULAR | Status: AC
  Administered 2014-07-06: 30 mg via INTRAVENOUS
  Filled 2014-07-06: qty 1

## 2014-07-06 MED ORDER — SODIUM CHLORIDE 0.9 % IV BOLUS (SEPSIS)
1000.0000 mL | Freq: Once | INTRAVENOUS | Status: AC
  Administered 2014-07-06: 1000 mL via INTRAVENOUS

## 2014-07-06 MED ORDER — LIDOCAINE HCL (PF) 1 % IJ SOLN
0.9000 mL | Freq: Once | INTRAMUSCULAR | Status: AC
  Administered 2014-07-06: 0.9 mL
  Filled 2014-07-06: qty 5

## 2014-07-06 MED ORDER — CEFTRIAXONE SODIUM 250 MG IJ SOLR
250.0000 mg | Freq: Once | INTRAMUSCULAR | Status: AC
  Administered 2014-07-06: 250 mg via INTRAMUSCULAR
  Filled 2014-07-06: qty 250

## 2014-07-06 MED ORDER — DOXYCYCLINE HYCLATE 100 MG PO CAPS: 100.0000 mg | ORAL_CAPSULE | Freq: Two times a day (BID) | ORAL | Status: DC

## 2014-07-06 MED ORDER — IBUPROFEN 800 MG PO TABS: 800.0000 mg | ORAL_TABLET | Freq: Three times a day (TID) | ORAL | Status: DC | PRN

## 2014-07-06 NOTE — ED Notes (Signed)
Pt called nurse to room to advise she does not want to stay for ultrasound, wants discharge papers, prescriptions.  PA aware, d/c papers given to nurse.

## 2014-07-06 NOTE — ED Provider Notes (Signed)
CSN: [id number]     Arrival date & time 07/06/14  1228 History   First MD Initiated Contact with Patient 07/06/14 1404     Chief Complaint  Patient presents with  . Vaginal Bleeding     HPI: [name] is a 43 year old Caucasian female with PMH of Diverticulitis, Bipolar Disorder, and Ovarian cysts presenting to the ED on 07/06/2014 for abdominal pain that has been present for 3 weeks. Patient says it is a cramping sensation that is mostly in her suprapubic area and radiates occasionally into her back. She says the pain has been a constant 10/10 and the only thing that alleviates her pain is a hot bath. She has tried taking Aleve but received no relief.  Patient reports she had unprotected sex last night and is worried about STD's. She also reports dyspareunia during that episode. She also reports having irregular vaginal bleeding over the past several months with bleeding 2 days, stopping, then bleeding 2 more days. She endorses hot flashes and night sweats but says she has never been diagnosed as going through menopause.  Past Medical History  Diagnosis Date  . Bipolar 1 disorder   . Major depressive disorder   . Diverticulitis    Past Surgical History  Procedure Laterality Date  . Eye surgery  1985   Family History  Problem Relation Age of Onset  . Cancer Other   . CAD Other   . Emphysema Other    History  Substance Use Topics  . Smoking status: Current Every Day Smoker -- 0.50 packs/day    Types: Cigarettes  . Smokeless tobacco: Not on file     Comment: decrease smoking to 0.5 ppd  . Alcohol Use: No   OB History    No data available     Review of Systems  Constitutional: Positive for diaphoresis and fatigue. Negative for fever, chills, activity change, appetite change and unexpected weight change.  HENT: Negative for congestion, ear pain, mouth sores, nosebleeds, postnasal drip, rhinorrhea, sinus pressure and trouble swallowing.   Eyes: Negative for photophobia and  visual disturbance.  Respiratory: Negative for cough, choking, chest tightness, shortness of breath and wheezing.   Cardiovascular: Negative for chest pain, palpitations and leg swelling.  Gastrointestinal: Positive for abdominal pain. Negative for nausea, vomiting, diarrhea, constipation, blood in stool, abdominal distention, anal bleeding and rectal pain.  Endocrine: Negative for polydipsia, polyphagia and polyuria.  Genitourinary: Positive for dyspareunia. Negative for dysuria, frequency, flank pain and difficulty urinating.      Allergies  Codeine  Home Medications   Prior to Admission medications   Medication Sig Start Date End Date Taking? Authorizing Provider  ibuprofen (ADVIL,MOTRIN) 200 MG tablet Take 800 mg by mouth every 6 (six) hours as needed (pain).   Yes Historical Provider, MD  HYDROcodone-acetaminophen (NORCO/VICODIN) 5-325 MG per tablet Take 1-2 tablets by mouth every 6 (six) hours as needed. Patient not taking: Reported on 07/06/2014 12/07/13   Antony MaduraKelly Humes, PA-C  meloxicam (MOBIC) 7.5 MG tablet Take 2 tablets (15 mg total) by mouth daily. Patient not taking: Reported on 07/06/2014 12/07/13   Antony MaduraKelly Humes, PA-C  oxyCODONE-acetaminophen (PERCOCET) 5-325 MG per tablet Take 1 tablet by mouth every 6 (six) hours as needed. Patient not taking: Reported on 07/06/2014 05/21/14   Mellody DrownLauren Parker, PA-C  oxyCODONE-acetaminophen (PERCOCET/ROXICET) 5-325 MG per tablet Take 1 tablet by mouth every 4 (four) hours as needed. Patient not taking: Reported on 07/06/2014 03/13/14   Garlon HatchetLisa M Sanders, PA-C  predniSONE (  DELTASONE) 20 MG tablet Take 2 tablets (40 mg total) by mouth daily. Patient not taking: Reported on 07/06/2014 12/07/13   Antony MaduraKelly Humes, PA-C  predniSONE (DELTASONE) 20 MG tablet Take 2 tablets (40 mg total) by mouth daily. Take 40 mg by mouth daily for 3 days, then 20mg  by mouth daily for 3 days, then 10mg  daily for 3 days Patient not taking: Reported on 07/06/2014 03/13/14   Garlon HatchetLisa M Sanders,  PA-C  predniSONE (DELTASONE) 20 MG tablet Take 2 tablets (40 mg total) by mouth daily. Take 40 mg by mouth daily for 3 days, then 20mg  by mouth daily for 3 days, then 10mg  daily for 3 days Patient not taking: Reported on 07/06/2014 05/21/14   Mellody DrownLauren Parker, PA-C   BP 112/58 mmHg  Pulse 64  Temp(Src) 97.8 F (36.6 C)  Resp 18  SpO2 99%  LMP 06/22/2014 Physical Exam  Constitutional: She is oriented to person, place, and time. She appears well-developed and well-nourished. No distress.  HENT:  Head: Normocephalic and atraumatic.  Nose: Nose normal.  Mouth/Throat: Oropharynx is clear and moist. No oropharyngeal exudate.  Eyes: Conjunctivae are normal. Pupils are equal, round, and reactive to light.  Neck: Normal range of motion. Neck supple. No tracheal deviation present.  Cardiovascular: Normal rate, regular rhythm, normal heart sounds and intact distal pulses.  Exam reveals no gallop and no friction rub.   No murmur heard. Pulmonary/Chest: Effort normal and breath sounds normal. No stridor. No respiratory distress. She has no wheezes. She has no rales. She exhibits no tenderness.  Abdominal: Soft. Bowel sounds are normal. She exhibits no distension and no mass. There is no hepatosplenomegaly. There is tenderness in the suprapubic area. There is no rebound, no guarding, no CVA tenderness, no tenderness at McBurney's point and negative Murphy's sign.  Genitourinary: Uterus normal. Vaginal discharge found.  White, creamy discharge appreciated on pelvic exam. No cervical motion tenderness noted.  Musculoskeletal: She exhibits no edema.  Lymphadenopathy:    She has no cervical adenopathy.  Neurological: She is alert and oriented to person, place, and time. No cranial nerve deficit. Coordination normal.  Skin: Skin is warm and dry. No rash noted. She is not diaphoretic. No erythema. No pallor.  Nursing note and vitals reviewed.   ED Course  Procedures (including critical care time) Labs  Review Labs Reviewed  URINE CULTURE  GC/CHLAMYDIA PROBE AMP  WET PREP, GENITAL  URINALYSIS, ROUTINE W REFLEX MICROSCOPIC  PREGNANCY, URINE  RPR  RAPID HIV SCREEN ([facility])  CBC WITH DIFFERENTIAL  COMPREHENSIVE METABOLIC PANEL    The patient advised that she does not want to stay for ultrasound imaging.  Patient be treated for pain and referred to GYN.  Told to return here as needed.  We will treat for STDs as well as PID  MDM   Final diagnoses:  None       Carlyle DollyChristopher W Markez [place], PA-C 07/08/14 1624  Layla MawKristen N Ward, DO 07/09/14 0105

## 2014-07-06 NOTE — ED Notes (Signed)
Pt reports irregular vaginal bleeding, had two periods within the past month. Having cramping to lower abd and back, reports having unprotected sex and needs to be checked for std. Also has increase in pain when having sex, denies any vaginal discharge.

## 2014-07-06 NOTE — Discharge Instructions (Signed)
Return here as needed. Follow up with the GYN clinic provided.

## 2014-07-07 LAB — URINE CULTURE: Colony Count: 80000

## 2014-07-07 LAB — GC/CHLAMYDIA PROBE AMP
CT Probe RNA: NEGATIVE
GC Probe RNA: NEGATIVE

## 2015-12-18 ENCOUNTER — Emergency Department (HOSPITAL_COMMUNITY)
Admission: EM | Admit: 2015-12-18 | Discharge: 2015-12-18 | Disposition: A | Discharge status: Home / Self Care | Payer: No Typology Code available for payment source | Attending: Emergency Medicine | Admitting: Emergency Medicine

## 2015-12-18 ENCOUNTER — Encounter (HOSPITAL_COMMUNITY): Payer: Self-pay | Admitting: Emergency Medicine

## 2015-12-18 DIAGNOSIS — F319 Bipolar disorder, unspecified: Secondary | ICD-10-CM | POA: Insufficient documentation

## 2015-12-18 DIAGNOSIS — F17210 Nicotine dependence, cigarettes, uncomplicated: Secondary | ICD-10-CM | POA: Insufficient documentation

## 2015-12-18 DIAGNOSIS — Z7952 Long term (current) use of systemic steroids: Secondary | ICD-10-CM | POA: Insufficient documentation

## 2015-12-18 DIAGNOSIS — Z791 Long term (current) use of non-steroidal anti-inflammatories (NSAID): Secondary | ICD-10-CM | POA: Insufficient documentation

## 2015-12-18 DIAGNOSIS — Z79891 Long term (current) use of opiate analgesic: Secondary | ICD-10-CM | POA: Insufficient documentation

## 2015-12-18 DIAGNOSIS — M5416 Radiculopathy, lumbar region: Secondary | ICD-10-CM | POA: Insufficient documentation

## 2015-12-18 DIAGNOSIS — Z79899 Other long term (current) drug therapy: Secondary | ICD-10-CM | POA: Insufficient documentation

## 2015-12-18 HISTORY — DX: Sciatica, unspecified side: M54.30

## 2015-12-18 MED ORDER — OXYCODONE-ACETAMINOPHEN 5-325 MG PO TABS
1.0000 | ORAL_TABLET | Freq: Once | ORAL | Status: AC
Start: 2015-12-18 — End: 2015-12-18
  Administered 2015-12-18: 1 via ORAL
  Filled 2015-12-18: qty 1

## 2015-12-18 MED ORDER — PREDNISONE 20 MG PO TABS
60.0000 mg | ORAL_TABLET | Freq: Once | ORAL | Status: AC
  Administered 2015-12-18: 60 mg via ORAL
  Filled 2015-12-18: qty 3

## 2015-12-18 MED ORDER — IBUPROFEN 600 MG PO TABS: 600.0000 mg | ORAL_TABLET | Freq: Four times a day (QID) | ORAL | Status: DC | PRN

## 2015-12-18 MED ORDER — PREDNISONE 10 MG PO TABS: ORAL_TABLET | ORAL | Status: DC

## 2015-12-18 MED ORDER — KETOROLAC TROMETHAMINE 60 MG/2ML IM SOLN
30.0000 mg | Freq: Once | INTRAMUSCULAR | Status: AC
  Administered 2015-12-18: 30 mg via INTRAMUSCULAR
  Filled 2015-12-18: qty 2

## 2015-12-18 MED ORDER — OXYCODONE-ACETAMINOPHEN 5-325 MG PO TABS: 1.0000 | ORAL_TABLET | ORAL | Status: DC | PRN

## 2015-12-18 NOTE — ED Provider Notes (Signed)
CSN: [id number]     Arrival date & time 12/18/15  [id number] History   First MD Initiated Contact with Patient 12/18/15 [phone]     Chief Complaint  Patient presents with  . Back Pain     (Consider location/radiation/quality/duration/timing/severity/associated sxs/prior Treatment) HPI Vanessa Wood is a 45 y.o. female presents to emergency department complaining of back pain. Patient states she has had recurrent sciatica pain for several years. She states that this pain started 2 weeks ago. She has tried multiple over-the-counter medications including Tylenol, ibuprofen, BC powder with no relief. She has tried heating pads stretching. She states pain is in the lower back and radiates into the left leg. States pain goes down the posterior leg and stops mid calf. She denies any weakness or numbness in her legs. She denies any trouble urinating or controlling her bowels. She denies any saddle paresthesia. She denies any fever or chills. She has had similar pain in the past and states it usually goes away on its own but this time has not [place]. She does not have a primary care doctor this time.  Past Medical History  Diagnosis Date  . Bipolar 1 disorder (HCC)   . Major depressive disorder (HCC)   . Diverticulitis   . Sciatic leg pain    Past Surgical History  Procedure Laterality Date  . Eye surgery  1985   Family History  Problem Relation Age of Onset  . Cancer Other   . CAD Other   . Emphysema Other    Social History  Substance Use Topics  . Smoking status: Current Every Day Smoker -- 0.50 packs/day for 31 years    Types: Cigarettes  . Smokeless tobacco: None     Comment: decrease smoking to 0.5 ppd  . Alcohol Use: Yes     Comment: 3-10 beers consumed in one sitting 2-3 days per week.   OB History    No data available     Review of Systems  Constitutional: Negative for fever and chills.  Respiratory: Negative for cough, chest tightness and shortness of breath.   Cardiovascular:  Negative for chest pain, palpitations and leg swelling.  Gastrointestinal: Negative for nausea, vomiting, abdominal pain and diarrhea.  Genitourinary: Negative for dysuria, flank pain, vaginal bleeding, vaginal discharge, vaginal pain and pelvic pain.  Musculoskeletal: Positive for back pain and arthralgias. Negative for neck pain and neck stiffness.  Skin: Negative for rash.  Neurological: Negative for dizziness, weakness, numbness and headaches.  All other systems reviewed and are negative.     Allergies  Codeine  Home Medications   Prior to Admission medications   Medication Sig Start Date End Date Taking? Authorizing Provider  doxycycline (VIBRAMYCIN) 100 MG capsule Take 1 capsule (100 mg total) by mouth 2 (two) times daily. 07/06/14   Charlestine Nighthristopher Lawyer, PA-C  HYDROcodone-acetaminophen (NORCO/VICODIN) 5-325 MG per tablet Take 1 tablet by mouth every 6 (six) hours as needed for moderate pain. 07/06/14   Charlestine Nighthristopher Lawyer, PA-C  ibuprofen (ADVIL,MOTRIN) 800 MG tablet Take 1 tablet (800 mg total) by mouth every 8 (eight) hours as needed. 07/06/14   Charlestine Nighthristopher Lawyer, PA-C  meloxicam (MOBIC) 7.5 MG tablet Take 2 tablets (15 mg total) by mouth daily. Patient not taking: Reported on 07/06/2014 12/07/13   Antony MaduraKelly Humes, PA-C  oxyCODONE-acetaminophen (PERCOCET) 5-325 MG per tablet Take 1 tablet by mouth every 6 (six) hours as needed. Patient not taking: Reported on 07/06/2014 05/21/14   Mellody DrownLauren Parker, PA-C  oxyCODONE-acetaminophen (PERCOCET/ROXICET) 5-325 MG per  tablet Take 1 tablet by mouth every 4 (four) hours as needed. Patient not taking: Reported on 07/06/2014 03/13/14   Garlon Hatchet, PA-C  predniSONE (DELTASONE) 20 MG tablet Take 2 tablets (40 mg total) by mouth daily. Patient not taking: Reported on 07/06/2014 12/07/13   Antony Madura, PA-C  predniSONE (DELTASONE) 20 MG tablet Take 2 tablets (40 mg total) by mouth daily. Take 40 mg by mouth daily for 3 days, then  by mouth daily for 3 days,  then  daily for 3 days Patient not taking: Reported on 07/06/2014 03/13/14   Garlon Hatchet, PA-C  predniSONE (DELTASONE) 20 MG tablet Take 2 tablets (40 mg total) by mouth daily. Take 40 mg by mouth daily for 3 days, then  by mouth daily for 3 days, then  daily for 3 days Patient not taking: Reported on 07/06/2014 05/21/14   Mellody Drown, PA-C   BP 136/61 mmHg  Pulse 63  Temp(Src) 97.6 F (36.4 C) (Oral)  Resp 16  SpO2 99% Physical Exam  Constitutional: She is oriented to person, place, and time. She appears well-developed and well-nourished. No distress.  HENT:  Head: Normocephalic.  Eyes: Conjunctivae are normal.  Neck: Neck supple.  Cardiovascular: Normal rate, regular rhythm and normal heart sounds.   Pulmonary/Chest: Effort normal and breath sounds normal. No respiratory distress. She has no wheezes. She has no rales.  Abdominal: Soft. Bowel sounds are normal. She exhibits no distension. There is no tenderness. There is no rebound.  Musculoskeletal: She exhibits no edema.  No tenderness to palpation to midline or paravertebral lumbar spine area. No tenderness to SI joint. Pain with left straight leg raise.  Neurological: She is alert and oriented to person, place, and time.  5/5 and equal lower extremity strength. 2+ and equal patellar reflexes bilaterally. Pt able to dorsiflex bilateral toes and feet with good strength against resistance. Equal sensation bilaterally over thighs and lower legs.   Skin: Skin is warm and dry.  Multiple contusions over arms, legs, back  Psychiatric: She has a normal mood and affect. Her behavior is normal.  Nursing note and vitals reviewed.   ED Course  Procedures (including critical care time) Labs Review Labs Reviewed - No data to display  Imaging Review No results found. I have personally reviewed and evaluated these images and lab results as part of my medical decision-making.   EKG Interpretation None      MDM   Final  diagnoses:  Lumbar radicular pain   Patient with recurrent lumbar radicular pain. No red flags to suggest cauda equina. No fever. No urinary symptoms. Will treat with NSAIDs, short course of steroids, Percocet for pain. Follow-up with primary care doctor.  Pt was noted to have multiple bruises to the entire body. I asked pt how she got them and she said "long story." I told her I was willing to listen, however pt refused to give information. With husband not in the room, i asked her if she felt safe at home, and she said yes. Pt's husband is with patient and appears intoxicated.   Filed Vitals:   12/18/15 0950 12/18/15 1022  BP: 136/61 140/81  Pulse: 63 61  Temp: 97.6 F (36.4 C) 97.9 F (36.6 C)  TempSrc: Oral Oral  Resp: 16 18  SpO2: 99% 99%     Jaynie Crumble, PA-C 12/18/15 1606  Gerhard Munch, MD 12/20/15 1041

## 2015-12-18 NOTE — ED Notes (Signed)
Hx of sciatica. Lower back pain radiating down left leg. No new injury to back. Pain with sitting, standing, walking. OTC medications not working. On-going x 2 weeks.

## 2015-12-18 NOTE — Discharge Instructions (Signed)
Take ibuprofen for pain. Take percocet for severe pain only. Do not drink alcohol if taking this medication. Take prednisone until all gone. Follow up with family doctor.   Lumbosacral Radiculopathy Lumbosacral radiculopathy is a condition that involves the spinal nerves and nerve roots in the low back and bottom of the spine. The condition develops when these nerves and nerve roots move out of place or become inflamed and cause symptoms. CAUSES This condition may be caused by: 1. Pressure from a disk that bulges out of place (herniated disk). A disk is a plate of cartilage that separates bones in the spine. 2. Disk degeneration. 3. A narrowing of the bones of the lower back (spinal stenosis). 4. A tumor. 5. An infection. 6. An injury that places sudden pressure on the disks that cushion the bones of your lower spine. RISK FACTORS This condition is more likely to develop in: 1. Males aged 30-50 years. 2. Females aged 50-60 years. 3. People who lift improperly. 4. People who are overweight or live a sedentary lifestyle. 5. People who smoke. 6. People who perform repetitive activities that strain the spine. SYMPTOMS Symptoms of this condition include: 1. Pain that goes down from the back into the legs (sciatica). This is the most common symptom. The pain may be worse with sitting, coughing, or sneezing. 2. Pain and numbness in the arms and legs. 3. Muscle weakness. 4. Tingling. 5. Loss of bladder control or bowel control. DIAGNOSIS This condition is diagnosed with a physical exam and medical history. If the pain is lasting, you may have tests, such as: 1. MRI scan. 2. X-ray. 3. CT scan. 4. Myelogram. 5. Nerve conduction study. TREATMENT This condition is often treated with: 1. Hot packs and ice applied to affected areas. 2. Stretches to improve flexibility. 3. Exercises to strengthen back muscles. 4. Physical therapy. 5. Pain medicine. 6. A steroid injection in the spine. In  some cases, no treatment is needed. If the condition is long-lasting (chronic), or if symptoms are severe, treatment may involve surgery or lifestyle changes, such as following a weight loss plan. HOME CARE INSTRUCTIONS Medicines 1. Take medicines only as directed by your health care provider. 2. Do not drive or operate heavy machinery while taking pain medicine. Injury Care 1. Apply a heat pack to the injured area as directed by your health care provider. 2. Apply ice to the affected area: 1. Put ice in a plastic bag. 2. Place a towel between your skin and the bag. 3. Leave the ice on for 20-30 minutes, every 2 hours while you are awake or as needed. Or, leave the ice on for as long as directed by your health care provider. Other Instructions  If you were shown how to do any exercises or stretches, do them as directed by your health care provider.  If your health care provider prescribed a diet or exercise program, follow it as directed.  Keep all follow-up visits as directed by your health care provider. This is important. SEEK MEDICAL CARE IF:  Your pain does not improve over time even when taking pain medicines. SEEK IMMEDIATE MEDICAL CARE IF:  Your develop severe pain.  Your pain suddenly gets worse.  You develop increasing weakness in your legs.  You lose the ability to control your bladder or bowel.  You have difficulty walking or balancing.  You have a fever.   This information is not intended to replace advice given to you by your health care provider. Make sure you  discuss any questions you have with your health care provider.   Document Released: 07/18/2005 Document Revised: 12/02/2014 Document Reviewed: 07/14/2014 Elsevier Interactive Patient Education 2016 Else[employer] Inc.   Back Exercises The following exercises strengthen the muscles that help to support the back. They also help to keep the lower back flexible. Doing these exercises can help to prevent back pain  or lessen existing pain. If you have back pain or discomfort, try doing these exercises 2-3 times each day or as told by your health care provider. When the pain goes away, do them once each day, but increase the number of times that you repeat the steps for each exercise (do more repetitions). If you do not have back pain or discomfort, do these exercises once each day or as told by your health care provider. EXERCISES Single Knee to Chest Repeat these steps 3-5 times for each leg: 7. Lie on your back on a firm bed or the floor with your legs extended. 8. Bring one knee to your chest. Your other leg should stay extended and in contact with the floor. 9. Hold your knee in place by grabbing your knee or thigh. 10. Pull on your knee until you feel a gentle stretch in your lower back. 11. Hold the stretch for 10-30 seconds. 12. Slowly release and straighten your leg. Pelvic Tilt Repeat these steps 5-10 times: 7. Lie on your back on a firm bed or the floor with your legs extended. 8. Bend your knees so they are pointing toward the ceiling and your feet are flat on the floor. 9. Tighten your lower abdominal muscles to press your lower back against the floor. This motion will tilt your pelvis so your tailbone points up toward the ceiling instead of pointing to your feet or the floor. 10. With gentle tension and even breathing, hold this position for 5-10 seconds. Cat-Cow Repeat these steps until your lower back becomes more flexible: 6. Get into a hands-and-knees position on a firm surface. Keep your hands under your shoulders, and keep your knees under your hips. You may place padding under your knees for comfort. 7. Let your head hang down, and point your tailbone toward the floor so your lower back becomes rounded like the back of a cat. 8. Hold this position for 5 seconds. 9. Slowly lift your head and point your tailbone up toward the ceiling so your back forms a sagging arch like the back of a  cow. 10. Hold this position for 5 seconds. Press-Ups Repeat these steps 5-10 times: 6. Lie on your abdomen (face-down) on the floor. 7. Place your palms near your head, about shoulder-width apart. 8. While you keep your back as relaxed as possible and keep your hips on the floor, slowly straighten your arms to raise the top half of your body and lift your shoulders. Do not use your back muscles to raise your upper torso. You may adjust the placement of your hands to make yourself more comfortable. 9. Hold this position for 5 seconds while you keep your back relaxed. 10. Slowly return to lying flat on the floor. Bridges Repeat these steps 10 times: 7. Lie on your back on a firm surface. 8. Bend your knees so they are pointing toward the ceiling and your feet are flat on the floor. 9. Tighten your buttocks muscles and lift your buttocks off of the floor until your waist is at almost the same height as your knees. You should feel the muscles working in  your buttocks and the back of your thighs. If you do not feel these muscles, slide your feet 1-2 inches farther away from your buttocks. 10. Hold this position for 3-5 seconds. 11. Slowly lower your hips to the starting position, and allow your buttocks muscles to relax completely. If this exercise is too easy, try doing it with your arms crossed over your chest. Abdominal Crunches Repeat these steps 5-10 times: 3. Lie on your back on a firm bed or the floor with your legs extended. 4. Bend your knees so they are pointing toward the ceiling and your feet are flat on the floor. 5. Cross your arms over your chest. 6. Tip your chin slightly toward your chest without bending your neck. 7. Tighten your abdominal muscles and slowly raise your trunk (torso) high enough to lift your shoulder blades a tiny bit off of the floor. Avoid raising your torso higher than that, because it can put too much stress on your low back and it does not help to strengthen  your abdominal muscles. 8. Slowly return to your starting position. Back Lifts Repeat these steps 5-10 times: 3. Lie on your abdomen (face-down) with your arms at your sides, and rest your forehead on the floor. 4. Tighten the muscles in your legs and your buttocks. 5. Slowly lift your chest off of the floor while you keep your hips pressed to the floor. Keep the back of your head in line with the curve in your back. Your eyes should be looking at the floor. 6. Hold this position for 3-5 seconds. 7. Slowly return to your starting position. SEEK MEDICAL CARE IF:  Your back pain or discomfort gets much worse when you do an exercise.  Your back pain or discomfort does not lessen within 2 hours after you exercise. If you have any of these problems, stop doing these exercises right away. Do not do them again unless your health care provider says that you can. SEEK IMMEDIATE MEDICAL CARE IF:  You develop sudden, severe back pain. If this happens, stop doing the exercises right away. Do not do them again unless your health care provider says that you can.   This information is not intended to replace advice given to you by your health care provider. Make sure you discuss any questions you have with your health care provider.   Document Released: 08/25/2004 Document Revised: 04/08/2015 Document Reviewed: 09/11/2014 Elsevier Interactive Patient Education [employer]2016 Elsevier Inc.

## 2016-03-25 ENCOUNTER — Emergency Department (HOSPITAL_COMMUNITY)
Admission: EM | Admit: 2016-03-25 | Discharge: 2016-03-25 | Disposition: A | Discharge status: Home / Self Care | Payer: Self-pay | Attending: Emergency Medicine | Admitting: Emergency Medicine

## 2016-03-25 ENCOUNTER — Encounter (HOSPITAL_COMMUNITY): Payer: Self-pay | Admitting: Emergency Medicine

## 2016-03-25 DIAGNOSIS — Z791 Long term (current) use of non-steroidal anti-inflammatories (NSAID): Secondary | ICD-10-CM | POA: Insufficient documentation

## 2016-03-25 DIAGNOSIS — F1490 Cocaine use, unspecified, uncomplicated: Secondary | ICD-10-CM | POA: Insufficient documentation

## 2016-03-25 DIAGNOSIS — Z79899 Other long term (current) drug therapy: Secondary | ICD-10-CM | POA: Insufficient documentation

## 2016-03-25 DIAGNOSIS — M5442 Lumbago with sciatica, left side: Secondary | ICD-10-CM | POA: Insufficient documentation

## 2016-03-25 DIAGNOSIS — F1290 Cannabis use, unspecified, uncomplicated: Secondary | ICD-10-CM | POA: Insufficient documentation

## 2016-03-25 DIAGNOSIS — F17210 Nicotine dependence, cigarettes, uncomplicated: Secondary | ICD-10-CM | POA: Insufficient documentation

## 2016-03-25 HISTORY — DX: Personal history of other diseases of the musculoskeletal system and connective tissue: Z87.39

## 2016-03-25 MED ORDER — IBUPROFEN 800 MG PO TABS: 800.0000 mg | ORAL_TABLET | Freq: Three times a day (TID) | ORAL | 0 refills | Status: DC

## 2016-03-25 MED ORDER — KETOROLAC TROMETHAMINE 60 MG/2ML IM SOLN
60.0000 mg | Freq: Once | INTRAMUSCULAR | Status: AC
  Administered 2016-03-25: 60 mg via INTRAMUSCULAR
  Filled 2016-03-25: qty 2

## 2016-03-25 MED ORDER — PREDNISONE 10 MG (21) PO TBPK: 10.0000 mg | ORAL_TABLET | Freq: Every day | ORAL | 0 refills | Status: DC

## 2016-03-25 NOTE — Discharge Instructions (Signed)
You are given follow-up information.  Take medications as prescribed. Continue ice/heat packs.  Return for worsening symptoms, including new numbness/weakness, inability to walk or any other symptoms concerning to you.

## 2016-03-25 NOTE — ED Provider Notes (Signed)
[facility]-EMERGENCY DEPT Provider Note   CSN: [id number] Arrival date & time: 03/25/16  [id number]     History   Chief Complaint Chief Complaint  Patient presents with  . Back Pain  . Leg Pain    HPI Vanessa Wood is a 45 y.o. female.  HPI 45 year old female who presents with low back pain radiating to down the left leg behind the knee. This has been an ongoing issue for the past 6 months. No recent trauma or fall. States Being in one position for extended period time such as sitting, standing, walking will exacerbate symptoms. Has not had associated numbness or weakness of the lower extremities. Has not had bowel incontinence, urinary retention, fevers, chills, or night sweats. States no primary care provider.  Past Medical History:  Diagnosis Date  . Bipolar 1 disorder (HCC)   . Diverticulitis   . H/O degenerative disc disease   . Major depressive disorder (HCC)   . Sciatic leg pain     Patient Active Problem List   Diagnosis Date Noted  . Major depressive disorder (HCC)   . Hematemesis 03/30/2012  . Night sweats 03/30/2012  . Nausea & vomiting 03/30/2012  . Rhinorrhea 11/15/2011  . Id reaction 04/26/2011  . Fungal infection 04/11/2011  . POTENTIAL CARIES 04/20/2010  . UNSPECIFIED VAGINITIS AND VULVOVAGINITIS 04/20/2010  . ELBOW PAIN, RIGHT 12/11/2009  . OLECRANON BURSITIS, RIGHT 12/11/2009  . SORE THROAT 11/13/2009  . BACK PAIN, CHRONIC 10/30/2009  . ANKLE SPRAIN, LEFT 08/13/2009  . DISC DISEASE, LUMBOSACRAL SPINE 05/18/2009  . SPINAL STENOSIS OF LUMBAR REGION 04/03/2009  . OBESITY, UNSPECIFIED 03/24/2009  . GERD 02/13/2009  . SHOULDER PAIN, LEFT 02/13/2009  . DYSPNEA ON EXERTION 02/13/2009  . SCIATICA 11/18/2008  . BIPOLAR DISORDER UNSPECIFIED 09/02/2008  . ANXIETY STATE, UNSPECIFIED 09/02/2008  . 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 TOBACCO ABUSE 09/02/2008  . SPRAIN AND STRAIN OF LUMBOSACRAL 09/02/2008    Past Surgical History:  Procedure Laterality  Date  . EYE SURGERY  1985    OB History    No data available       Home Medications    Prior to Admission medications   Medication Sig Start Date End Date Taking? Authorizing Provider  doxycycline (VIBRAMYCIN) 100 MG capsule Take 1 capsule (100 mg total) by mouth 2 (two) times daily. Patient not taking: Reported on 12/18/2015 07/06/14   Charlestine Nighthristopher Lawyer, PA-C  FLUoxetine (PROZAC) 20 MG capsule Take 20 mg by mouth daily.    Historical Provider, MD  gabapentin (NEURONTIN) 100 MG capsule Take 100 mg by mouth 3 (three) times daily.    Historical Provider, MD  HYDROcodone-acetaminophen (NORCO/VICODIN) 5-325 MG per tablet Take 1 tablet by mouth every 6 (six) hours as needed for moderate pain. Patient not taking: Reported on 12/18/2015 07/06/14   Charlestine Nighthristopher Lawyer, PA-C  ibuprofen (ADVIL,MOTRIN) 600 MG tablet Take 1 tablet (600 mg total) by mouth every 6 (six) hours as needed. 12/18/15   Tatyana Kirichenko, PA-C  lurasidone (LATUDA) 40 MG TABS tablet Take 40 mg by mouth daily.    Historical Provider, MD  meloxicam (MOBIC) 7.5 MG tablet Take 2 tablets (15 mg total) by mouth daily. Patient not taking: Reported on 07/06/2014 12/07/13   Antony MaduraKelly Humes, PA-C  oxyCODONE-acetaminophen (PERCOCET) 5-325 MG tablet Take 1 tablet by mouth every 4 (four) hours as needed for severe pain. 12/18/15   Tatyana Kirichenko, PA-C  predniSONE (DELTASONE) 10 MG tablet Take 5 tab day 1, take 4 tab day 2,  take 3 tab day 3, take 2 tab day 4, and take 1 tab day 5 12/18/15   Tatyana Kirichenko, PA-C  traZODone (DESYREL) 50 MG tablet Take 50 mg by mouth at bedtime.    Historical Provider, MD    Family History Family History  Problem Relation Age of Onset  . Cancer Other   . CAD Other   . Emphysema Other     Social History Social History  Substance Use Topics  . Smoking status: Current Every Day Smoker    Packs/day: 0.50    Years: 31.00    Types: Cigarettes  . Smokeless tobacco: Not on file     Comment: decrease  smoking to 0.5 ppd  . Alcohol use Yes     Comment: 3-10 beers consumed in one sitting 2-3 days per week.     Allergies   Codeine   Review of Systems Review of Systems  Constitutional: Negative for fever.  Gastrointestinal: Negative for nausea and vomiting.  Musculoskeletal: Positive for back pain.  Allergic/Immunologic: Negative for immunocompromised state.  Hematological: Does not bruise/bleed easily.  All other systems reviewed and are negative.    Physical Exam Updated Vital Signs BP 137/91   Pulse 82   Temp 98.7 F (37.1 C) (Oral)   Resp 16   SpO2 98%   Physical Exam Physical Exam  Nursing note and vitals reviewed. Constitutional: Well developed, well nourished, non-toxic, and in no acute distress Head: Normocephalic and atraumatic.  Mouth/Throat: Oropharynx is clear and moist.  Neck: Neck supple Cardiovascular: +2 DP pulses bilaterally Pulmonary/Chest: Effort normal Abdominal: Soft. There is no tenderness. There is no rebound and no guarding.  Musculoskeletal: No tenderness along midline CTLS spine. Tenderness around left buttock, radiating down LLE.  Neurological: Alert, no facial droop, fluent speech, moves all extremities symmetrically, sensation to light touch intact in bilateral lower extremities, full strength in hip flexion/extension, knee flexion/extension, and ankle dorsi/plantar flexion bilaterally Skin: Skin is warm and dry.  Psychiatric: Cooperative   ED Treatments / Results  Labs (all labs ordered are listed, but only abnormal results are displayed) Labs Reviewed - No data to display  EKG  EKG Interpretation None       Radiology No results found.  Procedures Procedures (including critical care time)  Medications Ordered in ED Medications  ketorolac (TORADOL) injection 60 mg (not administered)     Initial Impression / Assessment and Plan / ED Course  I have reviewed the triage vital signs and the nursing notes.  Pertinent labs  & imaging results that were available during my care of the patient were reviewed by me and considered in my medical decision making (see chart for details).  Clinical Course   Presenting with left lower back pain with radicular pain down the back of the left lower extremity c/w sciatica vs lumbar radiculopathy. This is now a chronic issue for her. She is neurovascularly intact on exam. She has no concerning features of her history or exam that be suggestive of serious spine or spinal cord process. Given referral for ortho/spine and PCP. Will treat with NSAIDs and course of steroids. Given IM toradol in ED.   The patient appears reasonably screened and/or stabilized for discharge and I doubt any other medical condition or other [facility] requiring further screening, evaluation, or treatment in the ED at this time prior to discharge. Strict return and follow-up instructions reviewed. She expressed understanding of all discharge instructions and felt comfortable with the plan of care.   Final  Clinical Impressions(s) / ED Diagnoses   Final diagnoses:  Left-sided low back pain with left-sided sciatica    New Prescriptions New Prescriptions   No medications on file     Lavera Guise, MD 03/25/16 [phone]

## 2016-03-25 NOTE — ED Triage Notes (Signed)
Pt reports ongoing lower back pain radiating down L leg. Was told she may need surgery 4 months ago during medical visit. No new injuries. Hx of DJD.

## 2016-03-25 NOTE — ED Notes (Signed)
Patient is A & O x4.  She understood discharge instructions and medication prescriptions.

## 2016-04-07 ENCOUNTER — Ambulatory Visit: Payer: Medicaid Other | Attending: Internal Medicine | Admitting: Physician Assistant

## 2016-04-07 VITALS — BP 124/87 | HR 77 | Temp 98.0°F | Wt 232.2 lb

## 2016-04-07 DIAGNOSIS — R062 Wheezing: Secondary | ICD-10-CM | POA: Insufficient documentation

## 2016-04-07 DIAGNOSIS — F4320 Adjustment disorder, unspecified: Secondary | ICD-10-CM | POA: Insufficient documentation

## 2016-04-07 DIAGNOSIS — F4321 Adjustment disorder with depressed mood: Secondary | ICD-10-CM

## 2016-04-07 DIAGNOSIS — M5432 Sciatica, left side: Secondary | ICD-10-CM

## 2016-04-07 DIAGNOSIS — F329 Major depressive disorder, single episode, unspecified: Secondary | ICD-10-CM | POA: Insufficient documentation

## 2016-04-07 DIAGNOSIS — F319 Bipolar disorder, unspecified: Secondary | ICD-10-CM | POA: Insufficient documentation

## 2016-04-07 DIAGNOSIS — M62838 Other muscle spasm: Secondary | ICD-10-CM | POA: Insufficient documentation

## 2016-04-07 DIAGNOSIS — Z885 Allergy status to narcotic agent status: Secondary | ICD-10-CM | POA: Insufficient documentation

## 2016-04-07 DIAGNOSIS — Z23 Encounter for immunization: Secondary | ICD-10-CM | POA: Insufficient documentation

## 2016-04-07 DIAGNOSIS — M5442 Lumbago with sciatica, left side: Secondary | ICD-10-CM | POA: Insufficient documentation

## 2016-04-07 MED ORDER — ALBUTEROL SULFATE HFA 108 (90 BASE) MCG/ACT IN AERS: 1.0000 | INHALATION_SPRAY | Freq: Four times a day (QID) | RESPIRATORY_TRACT | 0 refills | Status: DC | PRN

## 2016-04-07 MED ORDER — NAPROXEN 500 MG PO TBEC: 500.0000 mg | DELAYED_RELEASE_TABLET | Freq: Two times a day (BID) | ORAL | 0 refills | Status: DC

## 2016-04-07 MED ORDER — METHOCARBAMOL 500 MG PO TABS: 500.0000 mg | ORAL_TABLET | Freq: Four times a day (QID) | ORAL | 0 refills | Status: DC

## 2016-04-07 NOTE — Progress Notes (Signed)
Patient ID: Vanessa Wood, female   DOB: 12/31/1970, 45 y.o.   MRN: [id number]   Vanessa PunSandra Fales, is a 45 y.o. female  XBJ:[id number]  [id number]  DOB - 08/05/1970  Subjective:  Chief Complaint and HPI: Vanessa Wood is a 45 y.o. female here today to establish care and for a follow up visit after being seen in the ED 03/25/2016 for low back pain and L leg pain X 6 months.  Prednisone and Ibuprofen have provided minimal relief.  NKI.  She does say she has DJD in her lower back and MRI 2010 said "Mild degenerative changes L4-5 and L5-S1 more notable to the left without right-sided nerve root compression."  Pain is mostly in L upper leg and lower back.  No weakness or paresthesias.  She needs an albuterol inhaler due to wheezing.  She has not had a PCP in a long time.  Lost her 44 yr old daughter about 1 year ago to an accidental overdose the day after her 22nd birthday.  Still having a hard time with it.  [facility] and has a psychiatrist, but has not seen a counselor or done any grief work/support.  She denies any SI/HI despite sometimes feeling as though it would be better for her family if she "wasnt around."  She denies any plan or intent to harm herself and says she would never harm herself because it would hurt her surviving daughter and family.  Family is supportive.  She has a surviving daughter who is at [employer] getting her degree in social work and counseling.  She does express spiritual beliefs that are an avenue of support.  ED/Hospital notes reviewed.     ROS:   Constitutional:  No f/c, No night sweats, No unexplained weight loss. EENT:  No vision changes, No blurry vision, No hearing changes. No mouth, throat, or ear problems.  Respiratory: No cough, No SOB Cardiac: No CP, no palpitations GI:  No abd pain, No N/V/D. GU: No Urinary s/sx Musculoskeletal: +back and L upper leg pain Neuro: No headache, no dizziness, no motor weakness.  Skin: No rash Endocrine:  No  polydipsia. No polyuria.  Psych: Denies SI/HI  No problems updated.  ALLERGIES: Allergies  Allergen Reactions  . Codeine Hives    PAST MEDICAL HISTORY: Past Medical History:  Diagnosis Date  . Bipolar 1 disorder (HCC)   . Diverticulitis   . H/O degenerative disc disease   . Major depressive disorder (HCC)   . Sciatic leg pain     MEDICATIONS AT HOME: Prior to Admission medications   Medication Sig Start Date End Date Taking? Authorizing Provider  albuterol (PROVENTIL HFA;VENTOLIN HFA) 108 (90 Base) MCG/ACT inhaler Inhale 1 puff into the lungs every 6 (six) hours as needed for wheezing or shortness of breath. 04/07/16   Anders SimmondsAngela M Chennel Olivos, PA-C  FLUoxetine (PROZAC) 20 MG capsule Take 20 mg by mouth daily.    Historical Provider, MD  gabapentin (NEURONTIN) 100 MG capsule Take 100 mg by mouth 3 (three) times daily.    Historical Provider, MD  ibuprofen (ADVIL,MOTRIN) 600 MG tablet Take 1 tablet (600 mg total) by mouth every 6 (six) hours as needed. 12/18/15   Tatyana Kirichenko, PA-C  ibuprofen (ADVIL,MOTRIN) 800 MG tablet Take 1 tablet (800 mg total) by mouth 3 (three) times daily. 03/25/16   Lavera Guiseana Duo Liu, MD  methocarbamol (ROBAXIN) 500 MG tablet Take 1 tablet (500 mg total) by mouth 4 (four) times daily. X 10 days  then prn muscle spasm 04/07/16   Anders Simmonds, PA-C  naproxen (EC NAPROSYN) 500 MG EC tablet Take 1 tablet (500 mg total) by mouth 2 (two) times daily with a meal. X 10 days then prn pain 04/07/16   Anders Simmonds, PA-C  traZODone (DESYREL) 50 MG tablet Take 50 mg by mouth at bedtime.    Historical Provider, MD     Objective:  EXAM:   Vitals:   04/07/16 1047  BP: 124/87  Pulse: 77  Temp: 98 F (36.7 C)  TempSrc: Oral  SpO2: 97%  Weight: 232 lb 3.2 oz (105.3 kg)    General appearance : A&OX3. NAD. Non-toxic-appearing HEENT: Atraumatic and Normocephalic.  PERRLA. EOM intact.  TM clear B. Mouth-MMM, post pharynx WNL w/o erythema, No PND. Neck: supple, no JVD. No  cervical lymphadenopathy. No thyromegaly Chest/Lungs:  Breathing-non-labored, Good air entry bilaterally, breath sounds normal without rales, rhonchi, or wheezing  CVS: S1 S2 regular, no murmurs, gallops, rubs  Abdomen: Bowel sounds present, Non tender and not distended with no gaurding, rigidity or rebound. Extremities: Bilateral Lower Ext shows no edema, both legs are warm to touch with = pulse throughout Neurology:  CN II-XII grossly intact, Non focal.  DTR=B U&L extremity Back-full S&ROM except as limited by obesity, but generally appropriate for size.  No bony process TTP or TTP over SI joint. L leg TTP over distribution of L sciatic nerve. Psych:  TP linear. J/I WNL. Normal speech. Appropriate eye contact and affect.  Skin:  No Rash  Data Review No results found for: HGBA1C   Assessment & Plan   1. Sciatica of left side with LBP Known DJD; deconditioned Further imaging not indicated for now.  She is applying for financial assistance. - naproxen (EC NAPROSYN) 500 MG EC tablet; Take 1 tablet (500 mg total) by mouth 2 (two) times daily with a meal. X 10 days then prn pain  Dispense: 60 tablet; Refill: 0 - methocarbamol (ROBAXIN) 500 MG tablet; Take 1 tablet (500 mg total) by mouth 4 (four) times daily. X 10 days then prn muscle spasm  Dispense: 120 tablet; Refill: 0 PT may be helpful as a next step  2. Wheezing - albuterol (PROVENTIL HFA;VENTOLIN HFA) 108 (90 Base) MCG/ACT inhaler; Inhale 1 puff into the lungs every 6 (six) hours as needed for wheezing or shortness of breath.  Dispense: 1 Inhaler; Refill: 0  3. Encounter for immunization - Flu Vaccine QUAD 36+ mos IM  4. Grief reaction Counseled and encouraged at length. Call Hospice for grief counseling [phone] ASAP. Try Nar-Anon or Alanon ASAP.  Also, keep all appointments with [facility] including upcoming counseling appointment in November.  Patient have been counseled extensively about nutrition and exercise  Return in  about 4 weeks (around 05/05/2016) for establish with PCP.  The patient was given clear instructions to go to ER or return to medical center if symptoms don't improve, worsen or new problems develop. The patient verbalized understanding. The patient was told to call to get lab results if they haven't heard anything in the next week.     Georgian Co, PA-C [facility] and [facility] [place], [place] [phone]   04/07/2016, 1:32 PM

## 2016-04-07 NOTE — Patient Instructions (Signed)
Bring all medications to next visit.  Call Hospice for grief counseling [phone]  Try Nar-Anon or Alanon

## 2016-11-28 ENCOUNTER — Encounter: Payer: Self-pay | Admitting: Family Medicine

## 2016-11-28 ENCOUNTER — Ambulatory Visit: Payer: Medicaid Other | Attending: Family Medicine | Admitting: Family Medicine

## 2016-11-28 VITALS — BP 125/84 | HR 74 | Temp 97.6°F | Ht 66.0 in | Wt 245.8 lb

## 2016-11-28 DIAGNOSIS — M47896 Other spondylosis, lumbar region: Secondary | ICD-10-CM | POA: Insufficient documentation

## 2016-11-28 DIAGNOSIS — F603 Borderline personality disorder: Secondary | ICD-10-CM | POA: Insufficient documentation

## 2016-11-28 DIAGNOSIS — K219 Gastro-esophageal reflux disease without esophagitis: Secondary | ICD-10-CM | POA: Insufficient documentation

## 2016-11-28 DIAGNOSIS — Z87891 Personal history of nicotine dependence: Secondary | ICD-10-CM | POA: Insufficient documentation

## 2016-11-28 DIAGNOSIS — R0602 Shortness of breath: Secondary | ICD-10-CM | POA: Insufficient documentation

## 2016-11-28 DIAGNOSIS — M5137 Other intervertebral disc degeneration, lumbosacral region: Secondary | ICD-10-CM

## 2016-11-28 DIAGNOSIS — Z13228 Encounter for screening for other metabolic disorders: Secondary | ICD-10-CM

## 2016-11-28 DIAGNOSIS — F331 Major depressive disorder, recurrent, moderate: Secondary | ICD-10-CM | POA: Diagnosis not present

## 2016-11-28 DIAGNOSIS — M5432 Sciatica, left side: Secondary | ICD-10-CM | POA: Diagnosis not present

## 2016-11-28 DIAGNOSIS — M5442 Lumbago with sciatica, left side: Secondary | ICD-10-CM | POA: Insufficient documentation

## 2016-11-28 MED ORDER — METHOCARBAMOL 500 MG PO TABS: 500.0000 mg | ORAL_TABLET | Freq: Three times a day (TID) | ORAL | 1 refills | Status: DC | PRN

## 2016-11-28 MED ORDER — ALBUTEROL SULFATE HFA 108 (90 BASE) MCG/ACT IN AERS: 1.0000 | INHALATION_SPRAY | Freq: Four times a day (QID) | RESPIRATORY_TRACT | 3 refills | Status: DC | PRN

## 2016-11-28 MED ORDER — NAPROXEN 500 MG PO TBEC: 500.0000 mg | DELAYED_RELEASE_TABLET | Freq: Two times a day (BID) | ORAL | 2 refills | Status: DC

## 2016-11-28 MED FILL — METHOCARBAMOL 500 MG TABLET: 500 | 30 days supply | Qty: 90 | Fill #0

## 2016-11-28 MED FILL — !VENTOLIN HFA INHALER: 108 (90 BAS | 50 days supply | Qty: 18 | Fill #0

## 2016-11-28 NOTE — Progress Notes (Signed)
Takes another med from mental health but can't remember it

## 2016-11-28 NOTE — Progress Notes (Signed)
Subjective:  Patient ID: Vanessa Wood, female    DOB: 18-May-1971  Age: 46 y.o. MRN: [id number]  CC: Back Pain (lower); Shortness of Breath; Gastroesophageal Reflux; Manic Behavior; and borderline personality disorder   HPI Vanessa Wood is a 46 year old female with a history of bipolar disorder, GERD, degenerative joint disease of the lumbar spine, chronic low back pain who presents today for follow-up visit.  She was last seen by the PA 7 months ago where she received a muscle relaxant and an NSAID for her lower back which she has run out of. Pain is rated at an 8/10 and is worse after prolonged standing. Pain at this time is a 0/10. She denies her legs giving out on her and denies numbness in extremities; also denies loss of sphincteric function.  Complains of difficulty "catching her breath" which has been ongoing for the last few weeks; she smokes one pack of cigarrete per day. Denies chest pains or wheezing. She receives mental health [facility]; denies being depressed, denies suicidal ideations.  Past Medical History:  Diagnosis Date  . Bipolar 1 disorder ([place])   . Diverticulitis   . H/O degenerative disc disease   . Major depressive disorder   . Sciatic leg pain     Past Surgical History:  Procedure Laterality Date  . EYE SURGERY  1985    Allergies  Allergen Reactions  . Codeine Hives     Outpatient Medications Prior to Visit  Medication Sig Dispense Refill  . FLUoxetine (PROZAC) 20 MG capsule Take 20 mg by mouth daily.    Marland Kitchen gabapentin (NEURONTIN) 100 MG capsule Take 100 mg by mouth 3 (three) times daily.    Marland Kitchen ibuprofen (ADVIL,MOTRIN) 600 MG tablet Take 1 tablet (600 mg total) by mouth every 6 (six) hours as needed. 30 tablet 0  . traZODone (DESYREL) 50 MG tablet Take 50 mg by mouth at bedtime.    Marland Kitchen albuterol (PROVENTIL HFA;VENTOLIN HFA) 108 (90 Base) MCG/ACT inhaler Inhale 1 puff into the lungs every 6 (six) hours as needed for wheezing or shortness of breath.  (Patient not taking: Reported on 11/28/2016) 1 Inhaler 0  . ibuprofen (ADVIL,MOTRIN) 800 MG tablet Take 1 tablet (800 mg total) by mouth 3 (three) times daily. 21 tablet 0  . methocarbamol (ROBAXIN) 500 MG tablet Take 1 tablet (500 mg total) by mouth 4 (four) times daily. X 10 days then prn muscle spasm (Patient not taking: Reported on 11/28/2016) 120 tablet 0  . naproxen (EC NAPROSYN) 500 MG EC tablet Take 1 tablet (500 mg total) by mouth 2 (two) times daily with a meal. X 10 days then prn pain (Patient not taking: Reported on 11/28/2016) 60 tablet 0   No facility-administered medications prior to visit.     ROS Review of Systems  Constitutional: Negative for activity change, appetite change and fatigue.  HENT: Negative for congestion, sinus pressure and sore throat.   Eyes: Negative for visual disturbance.  Respiratory: Negative for cough, chest tightness, shortness of breath and wheezing.   Cardiovascular: Negative for chest pain and palpitations.  Gastrointestinal: Negative for abdominal distention, abdominal pain and constipation.  Endocrine: Negative for polydipsia.  Genitourinary: Negative for dysuria and frequency.  Musculoskeletal: Positive for back pain. Negative for arthralgias.  Skin: Negative for rash.  Neurological: Negative for tremors, light-headedness and numbness.  Hematological: Does not bruise/bleed easily.  Psychiatric/Behavioral: Negative for agitation and behavioral problems.    Objective:  BP 125/84 (BP Location: Right Arm, Patient Position: Sitting,  Cuff Size: Large)   Pulse 74   Temp 97.6 F (36.4 C) (Oral)   Ht _0  (1.676 m)   Wt 245 lb 12.8 oz (111.5 kg)   SpO2 96%   BMI 39.67 kg/m   BP/Weight 11/28/2016 04/07/2016 3/00/7622  Systolic BP 633 354 562  Diastolic BP 84 87 91  Wt. (Lbs) 245.8 232.2 -  BMI 39.67 39.86 -      Physical Exam  Constitutional: She is oriented to person, place, and time. She appears well-developed and well-nourished.    Cardiovascular: Normal rate, normal heart sounds and intact distal pulses.   No murmur heard. Pulmonary/Chest: Effort normal and breath sounds normal. She has no wheezes. She has no rales. She exhibits no tenderness.  Abdominal: Soft. Bowel sounds are normal. She exhibits no distension and no mass. There is no tenderness.  Musculoskeletal: She exhibits tenderness (tenderness on palpation of lumbosacral spine; positive straight leg raise on the left).  Neurological: She is alert and oriented to person, place, and time.  Skin: Skin is warm and dry.  Psychiatric: She has a normal mood and affect.     Assessment & Plan:   1. Shortness of breath Will need to be evaluated for COPD given history of smoking Consider PFTs at next visit - albuterol (PROVENTIL HFA;VENTOLIN HFA) 108 (90 Base) MCG/ACT inhaler; Inhale 1 puff into the lungs every 6 (six) hours as needed for wheezing or shortness of breath.  Dispense: 1 Inhaler; Refill: 3  2. Sciatica of left side Uncontrolled due to running out of medications - methocarbamol (ROBAXIN) 500 MG tablet; Take 1 tablet (500 mg total) by mouth every 8 (eight) hours as needed for muscle spasms.  Dispense: 90 tablet; Refill: 1 - naproxen (EC NAPROSYN) 500 MG EC tablet; Take 1 tablet (500 mg total) by mouth 2 (two) times daily with a meal.  Dispense: 60 tablet; Refill: 2  3. Moderate episode of recurrent major depressive disorder (HCC) Currently on Prozac, trazodone on Trileptal as per mental health  4. DISC DISEASE, LUMBOSACRAL SPINE Uncontrolled Apply heat  5. Screening for metabolic disorder - BWL89+HTDS   Meds ordered this encounter  Medications  . albuterol (PROVENTIL HFA;VENTOLIN HFA) 108 (90 Base) MCG/ACT inhaler    Sig: Inhale 1 puff into the lungs every 6 (six) hours as needed for wheezing or shortness of breath.    Dispense:  1 Inhaler    Refill:  3  . methocarbamol (ROBAXIN) 500 MG tablet    Sig: Take 1 tablet (500 mg total) by mouth  every 8 (eight) hours as needed for muscle spasms.    Dispense:  90 tablet    Refill:  1  . naproxen (EC NAPROSYN) 500 MG EC tablet    Sig: Take 1 tablet (500 mg total) by mouth 2 (two) times daily with a meal.    Dispense:  60 tablet    Refill:  2    Follow-up: Return in about 6 weeks (around 01/09/2017) for folow up of back pain.   Arnoldo Morale MD

## 2016-11-29 ENCOUNTER — Other Ambulatory Visit: Payer: Self-pay | Admitting: Family Medicine

## 2016-11-29 DIAGNOSIS — M5432 Sciatica, left side: Secondary | ICD-10-CM

## 2016-11-29 LAB — CMP14+EGFR
ALT: 13 IU/L (ref 0–32)
AST: 15 IU/L (ref 0–40)
Albumin/Globulin Ratio: 1.4 (ref 1.2–2.2)
Albumin: 4.3 g/dL (ref 3.5–5.5)
Alkaline Phosphatase: 84 IU/L (ref 39–117)
BUN/Creatinine Ratio: 16 (ref 9–23)
BUN: 13 mg/dL (ref 6–24)
CALCIUM: 9.3 mg/dL (ref 8.7–10.2)
CHLORIDE: 99 mmol/L (ref 96–106)
CO2: 23 mmol/L (ref 18–29)
Creatinine, Ser: 0.83 mg/dL (ref 0.57–1.00)
GFR calc non Af Amer: 86 mL/min/{1.73_m2} (ref >59)
GFR, EST AFRICAN AMERICAN: 99 mL/min/{1.73_m2} (ref >59)
GLOBULIN, TOTAL: 3 g/dL (ref 1.5–4.5)
Glucose: 104 mg/dL — ABNORMAL HIGH (ref 65–99)
Potassium: 4.7 mmol/L (ref 3.5–5.2)
Sodium: 139 mmol/L (ref 134–144)
Total Protein: 7.3 g/dL (ref 6.0–8.5)

## 2016-11-29 MED ORDER — NAPROXEN 500 MG PO TABS: 500.0000 mg | ORAL_TABLET | Freq: Two times a day (BID) | ORAL | 1 refills | Status: DC

## 2016-11-29 MED FILL — NAPROXEN 500 MG TABLET: 500 | 30 days supply | Qty: 60 | Fill #0

## 2016-11-30 ENCOUNTER — Telehealth: Payer: Self-pay | Admitting: *Deleted

## 2016-11-30 NOTE — Telephone Encounter (Signed)
Patient called states she feels uti symptoms and would like to know if she could come in for urine sample. Please f/up

## 2016-11-30 NOTE — Telephone Encounter (Signed)
DOB verified by Pt  notified  labs are normal, Pt verbalized understanding

## 2016-11-30 NOTE — Telephone Encounter (Signed)
Called patient in attempt to schedule apt. Pt verbalized she has an apt. Scheduled on 12/01/16 at 0930.

## 2016-12-01 ENCOUNTER — Ambulatory Visit: Payer: Medicaid Other | Attending: Family Medicine

## 2016-12-01 DIAGNOSIS — R350 Frequency of micturition: Secondary | ICD-10-CM

## 2016-12-01 LAB — POCT URINALYSIS DIPSTICK
Glucose, UA: NEGATIVE
Leukocytes, UA: NEGATIVE
NITRITE UA: NEGATIVE
PH UA: 5 (ref 5.0–8.0)
Protein, UA: 30
RBC UA: NEGATIVE
UROBILINOGEN UA: 2 U/dL — AB

## 2016-12-01 NOTE — Addendum Note (Signed)
Addended by: Particia LatherPOLLOCK, JAY'A R on: 12/01/2016 09:31 AM   Modules accepted: Orders

## 2016-12-01 NOTE — Progress Notes (Signed)
Patient here for lab visit only 

## 2016-12-02 ENCOUNTER — Other Ambulatory Visit: Payer: Self-pay | Admitting: *Deleted

## 2016-12-08 ENCOUNTER — Telehealth: Payer: Self-pay | Admitting: Family Medicine

## 2016-12-08 NOTE — Telephone Encounter (Signed)
Pt informed of last urinalysis. c/o abdominal pain, dysuria, ongoing for several days.  Burning in outer vaginal area. Denies blisters, N/V.    Pt states she is unable to come in today for nurse visit and would prefer to see MD at visit. She was encouraged to drink lot of water and not caffeinated beverages. Aware to go to urgent care or ED if she has worsening sx's of fever, N/V, back pain. Apt scheduled on Friday at 10:15. Pt verbalized understanding.

## 2016-12-08 NOTE — Telephone Encounter (Signed)
Patient called the office asking to speak with nurse regarding the urine sample that she gave last week. Patient is experiencing pain and burning sensation. Pt needs to know the results of urine sample and what she can do for the pain. Please follow up.  Thank you.

## 2016-12-09 ENCOUNTER — Ambulatory Visit: Payer: Self-pay | Admitting: Family Medicine

## 2017-01-09 ENCOUNTER — Encounter: Payer: Self-pay | Admitting: Family Medicine

## 2017-01-09 ENCOUNTER — Ambulatory Visit: Payer: Medicaid Other | Attending: Family Medicine | Admitting: Family Medicine

## 2017-01-09 VITALS — BP 111/71 | HR 74 | Temp 97.8°F | Resp 18 | Ht 66.0 in | Wt 239.6 lb

## 2017-01-09 DIAGNOSIS — M5432 Sciatica, left side: Secondary | ICD-10-CM

## 2017-01-09 DIAGNOSIS — F331 Major depressive disorder, recurrent, moderate: Secondary | ICD-10-CM | POA: Diagnosis not present

## 2017-01-09 DIAGNOSIS — R0602 Shortness of breath: Secondary | ICD-10-CM | POA: Diagnosis not present

## 2017-01-09 DIAGNOSIS — K2100 Gastro-esophageal reflux disease with esophagitis, without bleeding: Secondary | ICD-10-CM

## 2017-01-09 DIAGNOSIS — R112 Nausea with vomiting, unspecified: Secondary | ICD-10-CM

## 2017-01-09 MED ORDER — ALBUTEROL SULFATE HFA 108 (90 BASE) MCG/ACT IN AERS: 1.0000 | INHALATION_SPRAY | Freq: Four times a day (QID) | RESPIRATORY_TRACT | 3 refills | Status: DC | PRN

## 2017-01-09 MED ORDER — METHOCARBAMOL 500 MG PO TABS: 500.0000 mg | ORAL_TABLET | Freq: Three times a day (TID) | ORAL | 1 refills | Status: DC | PRN

## 2017-01-09 MED ORDER — ONDANSETRON 4 MG PO TBDP
4.0000 mg | ORAL_TABLET | Freq: Once | ORAL | Status: AC
  Administered 2017-01-09: 4 mg via ORAL

## 2017-01-09 NOTE — Progress Notes (Signed)
Subjective:  Patient ID: Vanessa Wood, female    DOB: 1971-04-15  Age: 46 y.o. MRN: [id number]  CC: Follow-up visit  HPI Vanessa Wood  is a 46 year old female with a history of bipolar disorder, GERD, degenerative joint disease of the lumbar spine, chronic low back pain who presents today for follow-up visit.  She complains of epigastric cramping, nausea and vomiting which occur almost daily and she has not been taking any protein pump inhibitors. Denies fevers, body ache and informs me she thinks this is related to her reflux symptoms. Denies diarrhea or constipation.  Her back pain is doing well on Robaxin.  She receives mental health care at [facility] where she receives all her refills  She denies chest pains or shortness of breath and has no pedal edema.  Past Medical History:  Diagnosis Date  . Bipolar 1 disorder (HCC)   . Diverticulitis   . H/O degenerative disc disease   . Major depressive disorder   . Sciatic leg pain     Past Surgical History:  Procedure Laterality Date  . EYE SURGERY  1985    Allergies  Allergen Reactions  . Codeine Hives     Outpatient Medications Prior to Visit  Medication Sig Dispense Refill  . FLUoxetine (PROZAC) 20 MG capsule Take 20 mg by mouth daily.    Marland Kitchen gabapentin (NEURONTIN) 100 MG capsule Take 100 mg by mouth 3 (three) times daily.    . Oxcarbazepine (TRILEPTAL) 300 MG tablet Take 300 mg by mouth 2 (two) times daily.    . traZODone (DESYREL) 50 MG tablet Take 50 mg by mouth at bedtime.    Marland Kitchen albuterol (PROVENTIL HFA;VENTOLIN HFA) 108 (90 Base) MCG/ACT inhaler Inhale 1 puff into the lungs every 6 (six) hours as needed for wheezing or shortness of breath. 1 Inhaler 3  . ibuprofen (ADVIL,MOTRIN) 600 MG tablet Take 1 tablet (600 mg total) by mouth every 6 (six) hours as needed. 30 tablet 0  . methocarbamol (ROBAXIN) 500 MG tablet Take 1 tablet (500 mg total) by mouth every 8 (eight) hours as needed for muscle spasms. 90 tablet 1  .  naproxen (NAPROSYN) 500 MG tablet Take 1 tablet (500 mg total) by mouth 2 (two) times daily with a meal. 60 tablet 1   No facility-administered medications prior to visit.     ROS Review of Systems  Constitutional: Negative for activity change, appetite change and fatigue.  HENT: Negative for congestion, sinus pressure and sore throat.   Eyes: Negative for visual disturbance.  Respiratory: Negative for cough, chest tightness, shortness of breath and wheezing.   Cardiovascular: Negative for chest pain and palpitations.  Gastrointestinal:       See hpi  Endocrine: Negative for polydipsia.  Genitourinary: Negative for dysuria and frequency.  Musculoskeletal: Negative for arthralgias and back pain.  Skin: Negative for rash.  Neurological: Negative for tremors, light-headedness and numbness.  Hematological: Does not bruise/bleed easily.  Psychiatric/Behavioral: Negative for agitation and behavioral problems.    Objective:  BP 111/71 (BP Location: Left Arm, Patient Position: Sitting, Cuff Size: Normal)   Pulse 74   Temp 97.8 F (36.6 C) (Oral)   Resp 18   Ht 5\' 6"  (1.676 m)   Wt 239 lb 9.6 oz (108.7 kg)   SpO2 96%   BMI 38.67 kg/m   BP/Weight 01/09/2017 11/28/2016 04/07/2016  Systolic BP 111 125 124  Diastolic BP 71 84 87  Wt. (Lbs) 239.6 245.8 232.2  BMI 38.67 39.67 39.86  Physical Exam  Constitutional: She is oriented to person, place, and time. She appears well-developed and well-nourished.  Cardiovascular: Normal rate, normal heart sounds and intact distal pulses.   No murmur heard. Pulmonary/Chest: Effort normal and breath sounds normal. She has no wheezes. She has no rales. She exhibits no tenderness.  Abdominal: Soft. Bowel sounds are normal. She exhibits no distension and no mass. There is tenderness (slight epigastric tenderness).  Musculoskeletal: Normal range of motion.  Neurological: She is alert and oriented to person, place, and time.    CMP Latest Ref  Rng & Units 11/28/2016 07/06/2014 08/19/2013  Glucose 65 - 99 mg/dL 409(W104(H) 92 92  BUN 6 - 24 mg/dL 13 7 13   Creatinine 0.57 - 1.00 mg/dL 1.190.83 1.470.67 8.290.82  Sodium 134 - 144 mmol/L 139 138 139  Potassium 3.5 - 5.2 mmol/L 4.7 4.1 4.7  Chloride 96 - 106 mmol/L 99 103 103  CO2 18 - 29 mmol/L 23 24 27   Calcium 8.7 - 10.2 mg/dL 9.3 9.1 8.7  Total Protein 6.0 - 8.5 g/dL 7.3 6.6 -  Total Bilirubin 0.0 - 1.2 mg/dL <5.6<0.2 2.1(H0.2(L) -  Alkaline Phos 39 - 117 IU/L 84 59 -  AST 0 - 40 IU/L 15 15 -  ALT 0 - 32 IU/L 13 8 -      Assessment & Plan:   1. Shortness of breath Stable - albuterol (PROVENTIL HFA;VENTOLIN HFA) 108 (90 Base) MCG/ACT inhaler; Inhale 1 puff into the lungs every 6 (six) hours as needed for wheezing or shortness of breath.  Dispense: 1 Inhaler; Refill: 3  2. Sciatica of left side Controlled - methocarbamol (ROBAXIN) 500 MG tablet; Take 1 tablet (500 mg total) by mouth every 8 (eight) hours as needed for muscle spasms.  Dispense: 90 tablet; Refill: 1  3. Gastroesophageal reflux disease with esophagitis Has not been taking PPI Will need to exclude H. pylori - H. pylori breath test  4. Moderate episode of recurrent major depressive disorder (HCC)/ Bipolar depression Controlled Continue Prozac and Trileptal [facility]  5. Nausea and vomiting, intractability of vomiting not specified, unspecified vomiting type IM Zofran administered   Meds ordered this encounter  Medications  . albuterol (PROVENTIL HFA;VENTOLIN HFA) 108 (90 Base) MCG/ACT inhaler    Sig: Inhale 1 puff into the lungs every 6 (six) hours as needed for wheezing or shortness of breath.    Dispense:  1 Inhaler    Refill:  3  . methocarbamol (ROBAXIN) 500 MG tablet    Sig: Take 1 tablet (500 mg total) by mouth every 8 (eight) hours as needed for muscle spasms.    Dispense:  90 tablet    Refill:  1    Follow-up: Return in about 3 weeks (around 01/30/2017) for PAP smear and follow up..   This  note has been created with Education officer, environmentalDragon speech recognition software and smart phrase technology. Any transcriptional errors are unintentional.     Jaclyn ShaggyEnobong Amao MD

## 2017-01-09 NOTE — Progress Notes (Signed)
Patient is here for f/up  

## 2017-01-10 LAB — H. PYLORI BREATH TEST: H pylori Breath Test: NEGATIVE

## 2017-01-17 ENCOUNTER — Telehealth: Payer: Self-pay | Admitting: Family Medicine

## 2017-01-17 NOTE — Telephone Encounter (Signed)
Test came back normal

## 2017-01-17 NOTE — Telephone Encounter (Signed)
Pt calling to get the results from asome tests that she did on 01/09/17. Please f/u.

## 2017-01-19 NOTE — Telephone Encounter (Signed)
Pt aware of lab results. States she does not have medication at the pharmacy.  She states she has emesis episode every morning.

## 2017-01-20 MED ORDER — OMEPRAZOLE 20 MG PO CPDR: 20.0000 mg | DELAYED_RELEASE_CAPSULE | Freq: Every day | ORAL | 3 refills | Status: DC

## 2017-01-20 MED ORDER — PROMETHAZINE HCL 25 MG PO TABS: 25.0000 mg | ORAL_TABLET | Freq: Three times a day (TID) | ORAL | 0 refills | Status: DC | PRN

## 2017-01-20 NOTE — Telephone Encounter (Signed)
I have [facility]eprazole and [facility] [place] which she has on file.

## 2017-01-20 NOTE — Telephone Encounter (Signed)
Left message on voicemail Rx at the pharmacy.

## 2017-01-25 ENCOUNTER — Other Ambulatory Visit: Payer: Self-pay | Admitting: Family Medicine

## 2017-01-25 MED FILL — !VENTOLIN HFA INHALER: 108 (90 BAS | 50 days supply | Qty: 18 | Fill #0

## 2017-01-25 MED FILL — METHOCARBAMOL 500 MG TABLET: 500 | 30 days supply | Qty: 90 | Fill #0

## 2017-01-25 MED FILL — NAPROXEN 500 MG TABLET: 500 | 30 days supply | Qty: 60 | Fill #1

## 2017-01-30 ENCOUNTER — Encounter: Payer: Self-pay | Admitting: Family Medicine

## 2017-01-30 ENCOUNTER — Ambulatory Visit: Payer: Medicaid Other | Attending: Family Medicine | Admitting: Family Medicine

## 2017-01-30 VITALS — BP 119/78 | HR 87 | Temp 98.5°F | Resp 18 | Ht 66.0 in | Wt 246.0 lb

## 2017-01-30 DIAGNOSIS — Z9889 Other specified postprocedural states: Secondary | ICD-10-CM | POA: Insufficient documentation

## 2017-01-30 DIAGNOSIS — Z79899 Other long term (current) drug therapy: Secondary | ICD-10-CM | POA: Insufficient documentation

## 2017-01-30 DIAGNOSIS — N760 Acute vaginitis: Secondary | ICD-10-CM

## 2017-01-30 DIAGNOSIS — B9689 Other specified bacterial agents as the cause of diseases classified elsewhere: Secondary | ICD-10-CM

## 2017-01-30 DIAGNOSIS — Z124 Encounter for screening for malignant neoplasm of cervix: Secondary | ICD-10-CM

## 2017-01-30 DIAGNOSIS — G8929 Other chronic pain: Secondary | ICD-10-CM | POA: Insufficient documentation

## 2017-01-30 DIAGNOSIS — A5901 Trichomonal vulvovaginitis: Secondary | ICD-10-CM | POA: Insufficient documentation

## 2017-01-30 DIAGNOSIS — F319 Bipolar disorder, unspecified: Secondary | ICD-10-CM | POA: Insufficient documentation

## 2017-01-30 DIAGNOSIS — T148XXA Other injury of unspecified body region, initial encounter: Secondary | ICD-10-CM

## 2017-01-30 DIAGNOSIS — K219 Gastro-esophageal reflux disease without esophagitis: Secondary | ICD-10-CM | POA: Insufficient documentation

## 2017-01-30 DIAGNOSIS — S7011XA Contusion of right thigh, initial encounter: Secondary | ICD-10-CM | POA: Insufficient documentation

## 2017-01-30 DIAGNOSIS — X58XXXA Exposure to other specified factors, initial encounter: Secondary | ICD-10-CM | POA: Insufficient documentation

## 2017-01-30 DIAGNOSIS — M545 Low back pain: Secondary | ICD-10-CM | POA: Insufficient documentation

## 2017-01-30 DIAGNOSIS — Z888 Allergy status to other drugs, medicaments and biological substances status: Secondary | ICD-10-CM | POA: Insufficient documentation

## 2017-01-30 MED ORDER — OMEPRAZOLE 20 MG PO CPDR: 20.0000 mg | DELAYED_RELEASE_CAPSULE | Freq: Every day | ORAL | 3 refills | Status: DC

## 2017-01-30 MED ORDER — METRONIDAZOLE 0.75 % VA GEL: 1.0000 | Freq: Every day | VAGINAL | 0 refills | Status: DC

## 2017-01-30 MED ORDER — PROMETHAZINE HCL 25 MG PO TABS: 25.0000 mg | ORAL_TABLET | Freq: Three times a day (TID) | ORAL | 0 refills | Status: DC | PRN

## 2017-01-30 MED FILL — PROMETHAZINE 25 MG TABLET: 25 | 6 days supply | Qty: 20 | Fill #0

## 2017-01-30 MED FILL — OMEPRAZOLE DR 20 MG CAPSULE: 20 | 30 days supply | Qty: 30 | Fill #0

## 2017-01-30 MED FILL — VANDAZOLE VAGINAL 0.75% GEL: 0.75 | 5 days supply | Qty: 70 | Fill #0

## 2017-01-30 NOTE — Progress Notes (Signed)
Patient is here for PAP  Patient has taken medication today. Patient has eaten today.  Patient complains of left leg/thigh pain from falling and being bruised.

## 2017-01-30 NOTE — Progress Notes (Signed)
Subjective:  Patient ID: Vanessa Wood, female    DOB: 06/29/1971  Age: 46 y.o. MRN: [id number]  CC: Gynecologic Exam   HPI Vanessa BasquesSandra F Mcsweeney is a 46 year old female with a history of bipolar disorder, GERD, degenerative joint disease of the lumbar spine, chronic low back pain who presents today for a Pap smear. She recently took a fall and sustained a bruise on her left inner thigh  Past Medical History:  Diagnosis Date  . Bipolar 1 disorder (HCC)   . Diverticulitis   . H/O degenerative disc disease   . Major depressive disorder   . Sciatic leg pain     Past Surgical History:  Procedure Laterality Date  . EYE SURGERY  1985    Allergies  Allergen Reactions  . Codeine Hives     Outpatient Medications Prior to Visit  Medication Sig Dispense Refill  . albuterol (PROVENTIL HFA;VENTOLIN HFA) 108 (90 Base) MCG/ACT inhaler Inhale 1 puff into the lungs every 6 (six) hours as needed for wheezing or shortness of breath. 1 Inhaler 3  . FLUoxetine (PROZAC) 20 MG capsule Take 20 mg by mouth daily.    Marland Kitchen. gabapentin (NEURONTIN) 100 MG capsule Take 100 mg by mouth 3 (three) times daily.    . methocarbamol (ROBAXIN) 500 MG tablet Take 1 tablet (500 mg total) by mouth every 8 (eight) hours as needed for muscle spasms. 90 tablet 1  . Oxcarbazepine (TRILEPTAL) 300 MG tablet Take 300 mg by mouth 2 (two) times daily.    . traZODone (DESYREL) 50 MG tablet Take 50 mg by mouth at bedtime.    Marland Kitchen. omeprazole (PRILOSEC) 20 MG capsule Take 1 capsule (20 mg total) by mouth daily. 30 capsule 3  . promethazine (PHENERGAN) 25 MG tablet Take 1 tablet (25 mg total) by mouth every 8 (eight) hours as needed for nausea or vomiting. 20 tablet 0   No facility-administered medications prior to visit.     ROS Review of Systems  Constitutional: Negative for activity change, appetite change and fatigue.  HENT: Negative for congestion, sinus pressure and sore throat.   Eyes: Negative for visual disturbance.    Respiratory: Negative for cough, chest tightness, shortness of breath and wheezing.   Cardiovascular: Negative for chest pain and palpitations.  Gastrointestinal: Negative for abdominal distention, abdominal pain and constipation.  Endocrine: Negative for polydipsia.  Genitourinary: Negative for dysuria and frequency.  Musculoskeletal: Negative for arthralgias and back pain.  Skin:       Bruise  Neurological: Negative for tremors, light-headedness and numbness.  Hematological: Does not bruise/bleed easily.  Psychiatric/Behavioral: Negative for agitation and behavioral problems.    Objective:  BP 119/78 (BP Location: Left Arm, Patient Position: Sitting, Cuff Size: Large)   Pulse 87   Temp 98.5 F (36.9 C) (Oral)   Resp 18   Ht 5\' 6"  (1.676 m)   Wt 246 lb (111.6 kg)   SpO2 98%   BMI 39.71 kg/m   BP/Weight 01/30/2017 01/09/2017 11/28/2016  Systolic BP 119 111 125  Diastolic BP 78 71 84  Wt. (Lbs) 246 239.6 245.8  BMI 39.71 38.67 39.67      Physical Exam  Constitutional: She is oriented to person, place, and time. She appears well-developed and well-nourished.  Cardiovascular: Normal rate, normal heart sounds and intact distal pulses.   No murmur heard. Pulmonary/Chest: Effort normal and breath sounds normal. She has no wheezes. She has no rales. She exhibits no tenderness.  Abdominal: Soft. Bowel sounds are normal.  She exhibits no distension and no mass. There is no tenderness.  Genitourinary:  Genitourinary Comments: Normal external genitalia, vagina with whitish discharge, fishy odor. Normal cervix, normal adnexa  Musculoskeletal: Normal range of motion.  Neurological: She is alert and oriented to person, place, and time.  Skin:  Large bruise on medial aspect of the right     Assessment & Plan:   1. Screening for cervical cancer - Cytology - PAP [place]  2. Bacterial vaginosis - metroNIDAZOLE (METROGEL VAGINAL) 0.75 % vaginal gel; Place 1 Applicatorful  vaginally at bedtime.  Dispense: 70 g; Refill: 0  3. Bruise Advised that condition is self-limiting Apply ice if tender   Meds ordered this encounter  Medications  . omeprazole (PRILOSEC) 20 MG capsule    Sig: Take 1 capsule (20 mg total) by mouth daily.    Dispense:  30 capsule    Refill:  3  . promethazine (PHENERGAN) 25 MG tablet    Sig: Take 1 tablet (25 mg total) by mouth every 8 (eight) hours as needed for nausea or vomiting.    Dispense:  20 tablet    Refill:  0  . metroNIDAZOLE (METROGEL VAGINAL) 0.75 % vaginal gel    Sig: Place 1 Applicatorful vaginally at bedtime.    Dispense:  70 g    Refill:  0    Follow-up: 3 months  This note has been created with Education officer, environmental. Any transcriptional errors are unintentional.     Jaclyn Shaggy MD

## 2017-01-31 LAB — CYTOLOGY - PAP
BACTERIAL VAGINITIS: POSITIVE — AB
CHLAMYDIA, DNA PROBE: NEGATIVE
Candida vaginitis: NEGATIVE
Diagnosis: NEGATIVE
HPV (WINDOPATH): NOT DETECTED
NEISSERIA GONORRHEA: NEGATIVE
Trichomonas: POSITIVE — AB

## 2017-02-02 ENCOUNTER — Other Ambulatory Visit: Payer: Self-pay | Admitting: Family Medicine

## 2017-02-02 MED ORDER — METRONIDAZOLE 500 MG PO TABS: 2000.0000 mg | ORAL_TABLET | Freq: Once | ORAL | 0 refills | Status: AC

## 2017-02-02 MED FILL — ?METRONIDAZOLE 500 MG TABLE: 500 | 1 days supply | Qty: 4 | Fill #0

## 2017-02-03 ENCOUNTER — Other Ambulatory Visit: Payer: Self-pay | Admitting: *Deleted

## 2017-02-03 DIAGNOSIS — R0602 Shortness of breath: Secondary | ICD-10-CM

## 2017-02-03 MED ORDER — ALBUTEROL SULFATE HFA 108 (90 BASE) MCG/ACT IN AERS: 1.0000 | INHALATION_SPRAY | Freq: Four times a day (QID) | RESPIRATORY_TRACT | 3 refills | Status: DC | PRN

## 2017-02-03 NOTE — Telephone Encounter (Signed)
PRINTED FOR PASS PROGRAM 

## 2017-03-15 ENCOUNTER — Ambulatory Visit: Payer: Self-pay | Admitting: Family Medicine

## 2017-04-21 MED FILL — $VENTOLIN HFA 18G INHALER: 108 (90 BAS | 25 days supply | Qty: 18 | Fill #1

## 2017-04-21 MED FILL — ?OMEPRAZOLE DR 20 MG CAPSUL: 20 | 30 days supply | Qty: 30 | Fill #1

## 2017-04-21 MED FILL — METHOCARBAMOL 500 MG TABS: 500 | 30 days supply | Qty: 90 | Fill #1

## 2017-05-02 ENCOUNTER — Ambulatory Visit: Payer: Self-pay | Admitting: Family Medicine

## 2017-07-04 ENCOUNTER — Encounter: Payer: Self-pay | Admitting: Family Medicine

## 2017-07-04 ENCOUNTER — Ambulatory Visit: Payer: Medicaid Other | Attending: Family Medicine | Admitting: Family Medicine

## 2017-07-04 VITALS — BP 132/81 | HR 69 | Temp 97.7°F | Ht 66.0 in | Wt 261.0 lb

## 2017-07-04 DIAGNOSIS — F319 Bipolar disorder, unspecified: Secondary | ICD-10-CM | POA: Insufficient documentation

## 2017-07-04 DIAGNOSIS — Z885 Allergy status to narcotic agent status: Secondary | ICD-10-CM | POA: Insufficient documentation

## 2017-07-04 DIAGNOSIS — Z23 Encounter for immunization: Secondary | ICD-10-CM | POA: Diagnosis not present

## 2017-07-04 DIAGNOSIS — K2100 Gastro-esophageal reflux disease with esophagitis, without bleeding: Secondary | ICD-10-CM

## 2017-07-04 DIAGNOSIS — M5136 Other intervertebral disc degeneration, lumbar region: Secondary | ICD-10-CM | POA: Diagnosis not present

## 2017-07-04 DIAGNOSIS — Z79899 Other long term (current) drug therapy: Secondary | ICD-10-CM | POA: Insufficient documentation

## 2017-07-04 DIAGNOSIS — Z9889 Other specified postprocedural states: Secondary | ICD-10-CM | POA: Insufficient documentation

## 2017-07-04 MED ORDER — METHOCARBAMOL 500 MG PO TABS: 1000.0000 mg | ORAL_TABLET | Freq: Two times a day (BID) | ORAL | 3 refills | Status: DC | PRN

## 2017-07-04 MED ORDER — MELOXICAM 7.5 MG PO TABS: 7.5000 mg | ORAL_TABLET | Freq: Every day | ORAL | 3 refills | Status: DC

## 2017-07-04 MED ORDER — KETOROLAC TROMETHAMINE 60 MG/2ML IM SOLN
60.0000 mg | Freq: Once | INTRAMUSCULAR | Status: AC
Start: 2017-07-04 — End: 2017-07-04
  Administered 2017-07-04: 60 mg via INTRAMUSCULAR

## 2017-07-04 MED ORDER — PROMETHAZINE HCL 25 MG PO TABS: 25.0000 mg | ORAL_TABLET | Freq: Three times a day (TID) | ORAL | 0 refills | Status: DC | PRN

## 2017-07-04 MED ORDER — OMEPRAZOLE 20 MG PO CPDR: 20.0000 mg | DELAYED_RELEASE_CAPSULE | Freq: Every day | ORAL | 3 refills | Status: DC

## 2017-07-04 MED FILL — MELOXICAM 7.5 MG TABLET: 7.5 | 30 days supply | Qty: 30 | Fill #0

## 2017-07-04 MED FILL — ?OMEPRAZOLE DR 20MG CAPSULE: 20 | 30 days supply | Qty: 30 | Fill #0

## 2017-07-04 MED FILL — PROMETHAZINE 25 MG TABLET: 25 | 7 days supply | Qty: 20 | Fill #0

## 2017-07-04 MED FILL — METHOCARBAMOL 500 MG TABS: 500 | 30 days supply | Qty: 120 | Fill #0

## 2017-07-04 NOTE — Patient Instructions (Signed)

## 2017-07-04 NOTE — Progress Notes (Signed)
Subjective:  Patient ID: Vanessa Wood, female    DOB: 1970/10/05  Age: 46 y.o. MRN: [id number]  CC: Back Pain and Medication Refill   HPI Vanessa Wood is a 46 year old female with a history of bipolar disorder, GERD, chronic low back pain due to degenerative joint disease of the lumbar spine who presents today with worsening low back pain which she describes as 7/10 at this time.  She has had this pain for several years and has been unable to see orthopedics due to lack of finances.  Pain does not radiate down her lower extremities and she was previously prescribed Robaxin which did not help but then she ran out of it.  MRI lumbar spine 05/2009 IMPRESSION: Mild degenerative changes L4-5 and L5-S1 more notable to the left without right-sided nerve root compression.  She takes omeprazole for GERD and is requesting a refill.  Her bipolar disorder is managed by mental health at [facility] and she receives her prescriptions from psychiatry.  Past Medical History:  Diagnosis Date  . Bipolar 1 disorder (HCC)   . Diverticulitis   . H/O degenerative disc disease   . Major depressive disorder   . Sciatic leg pain     Past Surgical History:  Procedure Laterality Date  . EYE SURGERY  1985    Allergies  Allergen Reactions  . Codeine Hives     Outpatient Medications Prior to Visit  Medication Sig Dispense Refill  . albuterol (PROVENTIL HFA;VENTOLIN HFA) 108 (90 Base) MCG/ACT inhaler Inhale 1 puff into the lungs every 6 (six) hours as needed for wheezing or shortness of breath. 54 g 3  . FLUoxetine (PROZAC) 20 MG capsule Take 20 mg by mouth daily.    Marland Kitchen gabapentin (NEURONTIN) 100 MG capsule Take 100 mg by mouth 3 (three) times daily.    . traZODone (DESYREL) 50 MG tablet Take 50 mg by mouth at bedtime.    . methocarbamol (ROBAXIN) 500 MG tablet Take 1 tablet (500 mg total) by mouth every 8 (eight) hours as needed for muscle spasms. 90 tablet 1  . omeprazole (PRILOSEC) 20 MG  capsule Take 1 capsule (20 mg total) by mouth daily. 30 capsule 3  . metroNIDAZOLE (METROGEL VAGINAL) 0.75 % vaginal gel Place 1 Applicatorful vaginally at bedtime. (Patient not taking: Reported on 07/04/2017) 70 g 0  . Oxcarbazepine (TRILEPTAL) 300 MG tablet Take 300 mg by mouth 2 (two) times daily.    . promethazine (PHENERGAN) 25 MG tablet Take 1 tablet (25 mg total) by mouth every 8 (eight) hours as needed for nausea or vomiting. (Patient not taking: Reported on 07/04/2017) 20 tablet 0   No facility-administered medications prior to visit.     ROS Review of Systems  Constitutional: Negative for activity change, appetite change and fatigue.  HENT: Negative for congestion, sinus pressure and sore throat.   Eyes: Negative for visual disturbance.  Respiratory: Negative for cough, chest tightness, shortness of breath and wheezing.   Cardiovascular: Negative for chest pain and palpitations.  Gastrointestinal: Negative for abdominal distention, abdominal pain and constipation.  Endocrine: Negative for polydipsia.  Genitourinary: Negative for dysuria and frequency.  Musculoskeletal: Positive for back pain. Negative for arthralgias.  Skin: Negative for rash.  Neurological: Negative for tremors, light-headedness and numbness.  Hematological: Does not bruise/bleed easily.  Psychiatric/Behavioral: Negative for agitation and behavioral problems.    Objective:  BP 132/81   Pulse 69   Temp 97.7 F (36.5 C) (Oral)   Ht 5\' 6"  (1.676  m)   Wt 261 lb (118.4 kg)   LMP 07/02/2017   SpO2 96%   BMI 42.13 kg/m   BP/Weight 07/04/2017 01/30/2017 01/09/2017  Systolic BP 132 119 111  Diastolic BP 81 78 71  Wt. (Lbs) 261 246 239.6  BMI 42.13 39.71 38.67      Physical Exam  Constitutional: She is oriented to person, place, and time. She appears well-developed and well-nourished.  Cardiovascular: Normal rate, normal heart sounds and intact distal pulses.  No murmur heard. Pulmonary/Chest: Effort  normal and breath sounds normal. She has no wheezes. She has no rales. She exhibits no tenderness.  Abdominal: Soft. Bowel sounds are normal. She exhibits no distension and no mass. There is no tenderness.  Musculoskeletal: Normal range of motion. She exhibits tenderness (TTP across lumbar region; negative straight leg raise b/l).  Neurological: She is alert and oriented to person, place, and time.  Skin: Skin is warm and dry.  Psychiatric: She has a normal mood and affect.     MRI LUMBAR SPINE WITHOUT CONTRAST   Technique:  Multiplanar and multiecho pulse sequences of the lumbar spine were obtained without intravenous contrast.   Comparison: CT 11/20/2008.   Findings: Last fully open disc space is labeled L5-S1.  Present examination incorporates from the T11 through lower sacrum.  Conus L1 level.  T11-12 L3-4 unremarkable.   L4-5:  Mild disc desiccation.  Mild bulge/small broad-based central disc protrusion minimally more notable left paracentral position. Minimal indentation of ventral aspect of thecal sac.  Minimal facet joint degenerative changes.  Prominent epidural fat.   L5-S1:  Mild disc desiccation.  Mild bulge.  Small central disc protrusion minimally more notable to the left contributing to mild left lateral recess stenosis and slight crowding of the left S1 nerve root.  Prominent epidural fat.  Mild facet joint degenerative changes.   IMPRESSION: Mild degenerative changes L4-5 and L5-S1 more notable to the left without right-sided nerve root compression.   Assessment & Plan:   1. Degenerative disc disease, lumbar She currently does not take any medications for this Comments NSAIDs and refill muscle relaxant We will consider lidocaine patch if symptoms persist and possibly referral for epidural spinal injections - meloxicam (MOBIC) 7.5 MG tablet; Take 1 tablet (7.5 mg total) by mouth daily.  Dispense: 30 tablet; Refill: 3 - methocarbamol (ROBAXIN) 500 MG tablet;  Take 2 tablets (1,000 mg total) by mouth every 12 (twelve) hours as needed for muscle spasms.  Dispense: 120 tablet; Refill: 3 - ketorolac (TORADOL) injection 60 mg  2. Need for influenza vaccination - Flu Vaccine QUAD 36+ mos IM  3. Gastroesophageal reflux disease with esophagitis Controlled She uses promethazine as needed for nausea - omeprazole (PRILOSEC) 20 MG capsule; Take 1 capsule (20 mg total) by mouth daily.  Dispense: 30 capsule; Refill: 3 - promethazine (PHENERGAN) 25 MG tablet; Take 1 tablet (25 mg total) by mouth every 8 (eight) hours as needed for nausea or vomiting.  Dispense: 20 tablet; Refill: 0  4.  Polar disorder Continue Prozac, Trileptal, trazodone Keep appointment with mental health  Meds ordered this encounter  Medications  . omeprazole (PRILOSEC) 20 MG capsule    Sig: Take 1 capsule (20 mg total) by mouth daily.    Dispense:  30 capsule    Refill:  3  . promethazine (PHENERGAN) 25 MG tablet    Sig: Take 1 tablet (25 mg total) by mouth every 8 (eight) hours as needed for nausea or vomiting.  Dispense:  20 tablet    Refill:  0  . meloxicam (MOBIC) 7.5 MG tablet    Sig: Take 1 tablet (7.5 mg total) by mouth daily.    Dispense:  30 tablet    Refill:  3  . methocarbamol (ROBAXIN) 500 MG tablet    Sig: Take 2 tablets (1,000 mg total) by mouth every 12 (twelve) hours as needed for muscle spasms.    Dispense:  120 tablet    Refill:  3    Discontinue previous dose  . ketorolac (TORADOL) injection 60 mg    Follow-up: Return in about 3 months (around 10/02/2017) for Follow up on back pain.   Jaclyn ShaggyEnobong Amao MD

## 2017-10-04 ENCOUNTER — Ambulatory Visit: Payer: Self-pay | Admitting: Family Medicine

## 2017-10-11 ENCOUNTER — Telehealth: Payer: Self-pay | Admitting: Family Medicine

## 2017-10-11 ENCOUNTER — Ambulatory Visit: Payer: Self-pay | Attending: Family Medicine | Admitting: Physician Assistant

## 2017-10-11 VITALS — BP 121/75 | HR 83 | Temp 98.5°F | Resp 18 | Ht 66.0 in | Wt 268.0 lb

## 2017-10-11 DIAGNOSIS — F319 Bipolar disorder, unspecified: Secondary | ICD-10-CM | POA: Insufficient documentation

## 2017-10-11 DIAGNOSIS — M5136 Other intervertebral disc degeneration, lumbar region: Secondary | ICD-10-CM | POA: Insufficient documentation

## 2017-10-11 DIAGNOSIS — Z79899 Other long term (current) drug therapy: Secondary | ICD-10-CM | POA: Insufficient documentation

## 2017-10-11 DIAGNOSIS — F17200 Nicotine dependence, unspecified, uncomplicated: Secondary | ICD-10-CM

## 2017-10-11 DIAGNOSIS — F17210 Nicotine dependence, cigarettes, uncomplicated: Secondary | ICD-10-CM | POA: Insufficient documentation

## 2017-10-11 DIAGNOSIS — M62838 Other muscle spasm: Secondary | ICD-10-CM | POA: Insufficient documentation

## 2017-10-11 MED ORDER — TRAMADOL HCL 50 MG PO TABS
50.0000 mg | ORAL_TABLET | Freq: Three times a day (TID) | ORAL | 0 refills | Status: DC | PRN
Start: 2017-10-11 — End: 2017-11-23

## 2017-10-11 MED ORDER — MELOXICAM 7.5 MG PO TABS: 7.5000 mg | ORAL_TABLET | Freq: Every day | ORAL | 3 refills | Status: DC

## 2017-10-11 MED ORDER — METHOCARBAMOL 500 MG PO TABS: 1000.0000 mg | ORAL_TABLET | Freq: Four times a day (QID) | ORAL | 3 refills | Status: DC | PRN

## 2017-10-11 MED FILL — traMADol HCL 50 MG TABS: 50 | 10 days supply | Qty: 30 | Fill #0

## 2017-10-11 MED FILL — METHOCARBAMOL 500 MG TABLET: 500 | 15 days supply | Qty: 120 | Fill #0

## 2017-10-11 MED FILL — MELOXICAM 7.5 MG TABLET: 7.5 | 30 days supply | Qty: 30 | Fill #0

## 2017-10-11 NOTE — Progress Notes (Signed)
Vanessa Wood, is a 47 y.o. female  WUJ:[id number]  NFA:[id number]  DOB - 1971-01-09  Subjective:  Chief Complaint and HPI: Vanessa Wood is a 47 y.o. female here today for flare up of LBP.  This happens on and off for years.  She has been sleeping on her couch which might be aggravating her LB.  NKI.  No films in a long time.  She says she has known DJD.  No paresthesias or weakness.  Doesn't have any mobic or muscle relaxer.   No bladder/bowel problems.  Smokes about 1/2ppd.  She has never considered herself a heavy smoker.    ROS:   Constitutional:  No f/c, No night sweats, No unexplained weight loss. EENT:  No vision changes, No blurry vision, No hearing changes. No mouth, throat, or ear problems.  Respiratory: No cough, No SOB Cardiac: No CP, no palpitations GI:  No abd pain, No N/V/D. GU: No Urinary s/sx Musculoskeletal: +LBP Neuro: No headache, no dizziness, no motor weakness.  Skin: No rash Endocrine:  No polydipsia. No polyuria.  Psych: Denies SI/HI  No problems updated.  ALLERGIES: Allergies  Allergen Reactions  . Codeine Hives    PAST MEDICAL HISTORY: Past Medical History:  Diagnosis Date  . Bipolar 1 disorder (HCC)   . Diverticulitis   . H/O degenerative disc disease   . Major depressive disorder   . Sciatic leg pain     MEDICATIONS AT HOME: Prior to Admission medications   Medication Sig Start Date End Date Taking? Authorizing Provider  albuterol (PROVENTIL HFA;VENTOLIN HFA) 108 (90 Base) MCG/ACT inhaler Inhale 1 puff into the lungs every 6 (six) hours as needed for wheezing or shortness of breath. 02/03/17  Yes Hoy Register, MD  gabapentin (NEURONTIN) 100 MG capsule Take 100 mg by mouth 3 (three) times daily.   Yes [provider]  meloxicam (MOBIC) 7.5 MG tablet Take 1 tablet (7.5 mg total) by mouth daily. 10/11/17  Yes Eros Montour, Marzella Schlein, PA-C  methocarbamol (ROBAXIN) 500 MG tablet Take 2 tablets (1,000 mg total) by mouth every 6 (six)  hours as needed for muscle spasms. 10/11/17  Yes Georgian Co M, PA-C  omeprazole (PRILOSEC) 20 MG capsule Take 1 capsule (20 mg total) by mouth daily. 07/04/17  Yes Hoy Register, MD  promethazine (PHENERGAN) 25 MG tablet Take 1 tablet (25 mg total) by mouth every 8 (eight) hours as needed for nausea or vomiting. 07/04/17  Yes Hoy Register, MD  traZODone (DESYREL) 50 MG tablet Take 50 mg by mouth at bedtime.   Yes [provider]     Objective:  EXAM:   Vitals:   10/11/17 1004  BP: 121/75  Pulse: 83  Resp: 18  Temp: 98.5 F (36.9 C)  TempSrc: Oral  SpO2: 96%  Weight: 268 lb (121.6 kg)  Height:  (1.676 m)    General appearance : A&OX3. NAD. Non-toxic-appearing HEENT: Atraumatic and Normocephalic.  PERRLA. EOM intact.   Neck: supple, no JVD. No cervical lymphadenopathy. No thyromegaly Chest/Lungs:  Breathing-non-labored, Good air entry bilaterally, breath sounds normal without rales, rhonchi, or wheezing  CVS: S1 S2 regular, no murmurs, gallops, rubs  Back:  paraspinus spasm BLB.  ROM ~70% of normal-partially limited by obesity.  Neg SLR B.  LE DTR=B intact. Extremities: Bilateral Lower Ext shows no edema, both legs are warm to touch with = pulse throughout Neurology:  CN II-XII grossly intact, Non focal.   Psych:  TP linear. J/I WNL. Normal speech. Appropriate eye contact  and affect.  Skin:  No Rash  Data Review No results found for: HGBA1C   Assessment & Plan   1. Muscle spasm - methocarbamol (ROBAXIN) 500 MG tablet; Take 2 tablets (1,000 mg total) by mouth every 6 (six) hours as needed for muscle spasms.  Dispense: 120 tablet; Refill: 3  2. Degenerative disc disease, lumbar No red flags - methocarbamol (ROBAXIN) 500 MG tablet; Take 2 tablets (1,000 mg total) by mouth every 6 (six) hours as needed for muscle spasms.  Dispense: 120 tablet; Refill: 3 - meloxicam (MOBIC) 7.5 MG tablet; Take 1 tablet (7.5 mg total) by mouth daily.  Dispense: 30 tablet;  Refill: 3 - DG Lumbar Spine Complete; Future  3. Smoking Smoking cessation advised-contemplative stage of change.       Patient have been counseled extensively about nutrition and exercise  Return in about 6 weeks (around 11/22/2017) for Dr Alvis Lemmings for labs/medical issues.  The patient was given clear instructions to go to ER or return to medical center if symptoms don't improve, worsen or new problems develop. The patient verbalized understanding. The patient was told to call to get lab results if they haven't heard anything in the next week.     Georgian Co, PA-C [facility] [place], [place] [phone]   10/11/2017, 10:21 AM

## 2017-10-11 NOTE — Telephone Encounter (Signed)
Pt called to request a refill on  -omeprazole (PRILOSEC) 20 MG capsule  -albuterol (PROVENTIL HFA;VENTOLIN HFA) 108 (90 Base) MCG/ACT inhaler  Sent to  -[facility] pharmacy Please follow up

## 2017-10-11 NOTE — Patient Instructions (Signed)
Steps to Quit Smoking Smoking tobacco can be bad for your health. It can also affect almost every organ in your body. Smoking puts you and people around you at risk for many serious long-lasting (chronic) diseases. Quitting smoking is hard, but it is one of the best things that you can do for your health. It is never too late to quit. What are the benefits of quitting smoking? When you quit smoking, you lower your risk for getting serious diseases and conditions. They can include:  Lung cancer or lung disease.  Heart disease.  Stroke.  Heart attack.  Not being able to have children (infertility).  Weak bones (osteoporosis) and broken bones (fractures).  If you have coughing, wheezing, and shortness of breath, those symptoms may get better when you quit. You may also get sick less often. If you are pregnant, quitting smoking can help to lower your chances of having a baby of low birth weight. What can I do to help me quit smoking? Talk with your doctor about what can help you quit smoking. Some things you can do (strategies) include:  Quitting smoking totally, instead of slowly cutting back how much you smoke over a period of time.  Going to in-person counseling. You are more likely to quit if you go to many counseling sessions.  Using resources and support systems, such as: ? Online chats with a counselor. ? Phone quitlines. ? Printed self-help materials. ? Support groups or group counseling. ? Text messaging programs. ? Mobile phone apps or applications.  Taking medicines. Some of these medicines may have nicotine in them. If you are pregnant or breastfeeding, do not take any medicines to quit smoking unless your doctor says it is okay. Talk with your doctor about counseling or other things that can help you.  Talk with your doctor about using more than one strategy at the same time, such as taking medicines while you are also going to in-person counseling. This can help make  quitting easier. What things can I do to make it easier to quit? Quitting smoking might feel very hard at first, but there is a lot that you can do to make it easier. Take these steps:  Talk to your family and friends. Ask them to support and encourage you.  Call phone quitlines, reach out to support groups, or work with a counselor.  Ask people who smoke to not smoke around you.  Avoid places that make you want (trigger) to smoke, such as: ? Bars. ? Parties. ? Smoke-break areas at work.  Spend time with people who do not smoke.  Lower the stress in your life. Stress can make you want to smoke. Try these things to help your stress: ? Getting regular exercise. ? Deep-breathing exercises. ? Yoga. ? Meditating. ? Doing a body scan. To do this, close your eyes, focus on one area of your body at a time from head to toe, and notice which parts of your body are tense. Try to relax the muscles in those areas.  Download or buy apps on your mobile phone or tablet that can help you stick to your quit plan. There are many free apps, such as QuitGuide from the CDC (Centers for Disease Control and Prevention). You can find more support from smokefree.gov and other websites.  This information is not intended to replace advice given to you by your health care provider. Make sure you discuss any questions you have with your health care provider. Document Released: 05/14/2009 Document   Revised: 03/15/2016 Document Reviewed: 12/02/2014 Elsevier Interactive Patient Education  2018 [employer]vier Inc.  

## 2017-10-12 ENCOUNTER — Other Ambulatory Visit: Payer: Self-pay

## 2017-10-12 DIAGNOSIS — K2100 Gastro-esophageal reflux disease with esophagitis, without bleeding: Secondary | ICD-10-CM

## 2017-10-12 DIAGNOSIS — R0602 Shortness of breath: Secondary | ICD-10-CM

## 2017-10-12 MED ORDER — OMEPRAZOLE 20 MG PO CPDR: 20.0000 mg | DELAYED_RELEASE_CAPSULE | Freq: Every day | ORAL | 3 refills | Status: DC

## 2017-10-12 MED ORDER — ALBUTEROL SULFATE HFA 108 (90 BASE) MCG/ACT IN AERS: 1.0000 | INHALATION_SPRAY | Freq: Four times a day (QID) | RESPIRATORY_TRACT | 3 refills | Status: DC | PRN

## 2017-10-12 MED FILL — OMEPRAZOLE DR 20 MG CAPSULE: 20 | 30 days supply | Qty: 30 | Fill #0

## 2017-10-12 MED FILL — ALBUTEROL SULFATE HFA 108 (: 108 (90 BAS | 25 days supply | Qty: 18 | Fill #0

## 2017-10-12 NOTE — Telephone Encounter (Signed)
Refilled and sent to onsite pharmacy.

## 2017-10-16 ENCOUNTER — Ambulatory Visit: Payer: Self-pay | Attending: Family Medicine

## 2017-10-16 ENCOUNTER — Ambulatory Visit (HOSPITAL_COMMUNITY)
Admission: RE | Admit: 2017-10-16 | Discharge: 2017-10-16 | Disposition: A | Discharge status: Home / Self Care | Payer: Self-pay | Source: Ambulatory Visit | Attending: Physician Assistant | Admitting: Physician Assistant

## 2017-10-16 DIAGNOSIS — M5136 Other intervertebral disc degeneration, lumbar region: Secondary | ICD-10-CM | POA: Insufficient documentation

## 2017-10-16 DIAGNOSIS — M545 Low back pain: Secondary | ICD-10-CM | POA: Insufficient documentation

## 2017-10-18 ENCOUNTER — Telehealth: Payer: Self-pay | Admitting: *Deleted

## 2017-10-18 NOTE — Telephone Encounter (Signed)
Patient verified DOB Patient is aware of lower back xray showing disc bulging, spurs and arthritis. Patient is advised to keep appointment with PCP and to complete financials prior to that in order for referral to be processed. No further questions at this time.

## 2017-10-18 NOTE — Telephone Encounter (Signed)
-----   Message from Anders SimmondsAngela M McClung, [place]PA-C sent at 10/18/2017  8:07 AM EDT ----- Please call patient.  Her lower back xrays showed some spurring, arthritis and disc bulging.  If her symptoms arent improving, she may need firther testing or referral.  She should follow with Dr Alvis LemmingsNewlin as planned.  Thanks, Georgian CoAngela McClung, PA-C

## 2017-10-23 ENCOUNTER — Ambulatory Visit: Payer: Self-pay | Attending: Family Medicine

## 2017-11-02 ENCOUNTER — Ambulatory Visit: Payer: Self-pay | Attending: Internal Medicine | Admitting: Internal Medicine

## 2017-11-02 ENCOUNTER — Encounter: Payer: Self-pay | Admitting: Internal Medicine

## 2017-11-02 ENCOUNTER — Ambulatory Visit: Payer: Self-pay | Admitting: Family Medicine

## 2017-11-02 VITALS — BP 129/87 | HR 73 | Temp 97.8°F | Resp 16 | Wt 271.0 lb

## 2017-11-02 DIAGNOSIS — N898 Other specified noninflammatory disorders of vagina: Secondary | ICD-10-CM | POA: Insufficient documentation

## 2017-11-02 DIAGNOSIS — Z791 Long term (current) use of non-steroidal anti-inflammatories (NSAID): Secondary | ICD-10-CM | POA: Insufficient documentation

## 2017-11-02 DIAGNOSIS — Z885 Allergy status to narcotic agent status: Secondary | ICD-10-CM | POA: Insufficient documentation

## 2017-11-02 DIAGNOSIS — K219 Gastro-esophageal reflux disease without esophagitis: Secondary | ICD-10-CM | POA: Insufficient documentation

## 2017-11-02 DIAGNOSIS — F603 Borderline personality disorder: Secondary | ICD-10-CM | POA: Insufficient documentation

## 2017-11-02 DIAGNOSIS — Z79899 Other long term (current) drug therapy: Secondary | ICD-10-CM | POA: Insufficient documentation

## 2017-11-02 DIAGNOSIS — R300 Dysuria: Secondary | ICD-10-CM | POA: Insufficient documentation

## 2017-11-02 DIAGNOSIS — F319 Bipolar disorder, unspecified: Secondary | ICD-10-CM | POA: Insufficient documentation

## 2017-11-02 LAB — POCT URINALYSIS DIPSTICK
BILIRUBIN UA: NEGATIVE
Blood, UA: NEGATIVE
GLUCOSE UA: NEGATIVE
LEUKOCYTES UA: NEGATIVE
Nitrite, UA: NEGATIVE
Protein, UA: NEGATIVE
Spec Grav, UA: 1.025 (ref 1.010–1.025)
Urobilinogen, UA: 0.2 E.U./dL
pH, UA: 6 (ref 5.0–8.0)

## 2017-11-02 NOTE — Progress Notes (Signed)
Patient ID: Vanessa Wood, female    DOB: 03/14/1971  MRN: [id number]  CC:  Vaginal dischg  Subjective:  Vanessa Wood is a 47 y.o. female who presents for UC visit. Her concerns today include:   Pt c/o clear vaginal dischg x 1 mth. -no itching.  Treated for Trich 3 mths ago.  Boyfriend also treated through HD for same at same time. -+dysuria and suprapubic pain x 1 mth.  No fever -menses ended Thursday of last wk.  Still having some spotting. Hx of tubal ligation .  Patient Active Problem List   Diagnosis Date Noted  . Frequent urination 12/01/2016  . Major depressive disorder   . Hematemesis 03/30/2012  . Night sweats 03/30/2012  . Nausea & vomiting 03/30/2012  . Rhinorrhea 11/15/2011  . Id reaction 04/26/2011  . Fungal infection 04/11/2011  . POTENTIAL CARIES 04/20/2010  . UNSPECIFIED VAGINITIS AND VULVOVAGINITIS 04/20/2010  . ELBOW PAIN, RIGHT 12/11/2009  . OLECRANON BURSITIS, RIGHT 12/11/2009  . SORE THROAT 11/13/2009  . BACK PAIN, CHRONIC 10/30/2009  . ANKLE SPRAIN, LEFT 08/13/2009  . DISC DISEASE, LUMBOSACRAL SPINE 05/18/2009  . SPINAL STENOSIS OF LUMBAR REGION 04/03/2009  . OBESITY, UNSPECIFIED 03/24/2009  . GERD 02/13/2009  . SHOULDER PAIN, LEFT 02/13/2009  . DYSPNEA ON EXERTION 02/13/2009  . SCIATICA 11/18/2008  . BIPOLAR DISORDER UNSPECIFIED 09/02/2008  . ANXIETY STATE, UNSPECIFIED 09/02/2008  . 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 TOBACCO ABUSE 09/02/2008  . SPRAIN AND STRAIN OF LUMBOSACRAL 09/02/2008     Current Outpatient Medications on File Prior to Visit  Medication Sig Dispense Refill  . albuterol (PROVENTIL HFA;VENTOLIN HFA) 108 (90 Base) MCG/ACT inhaler Inhale 1 puff into the lungs every 6 (six) hours as needed for wheezing or shortness of breath. 54 g 3  . gabapentin (NEURONTIN) 100 MG capsule Take 100 mg by mouth 3 (three) times daily.    . meloxicam (MOBIC) 7.5 MG tablet Take 1 tablet (7.5 mg total) by mouth daily. 30 tablet 3  .  methocarbamol (ROBAXIN) 500 MG tablet Take 2 tablets (1,000 mg total) by mouth every 6 (six) hours as needed for muscle spasms. 120 tablet 3  . omeprazole (PRILOSEC) 20 MG capsule Take 1 capsule (20 mg total) by mouth daily. 30 capsule 3  . promethazine (PHENERGAN) 25 MG tablet Take 1 tablet (25 mg total) by mouth every 8 (eight) hours as needed for nausea or vomiting. 20 tablet 0  . traMADol (ULTRAM) 50 MG tablet Take 1 tablet (50 mg total) by mouth every 8 (eight) hours as needed. 30 tablet 0  . traZODone (DESYREL) 50 MG tablet Take 50 mg by mouth at bedtime.     No current facility-administered medications on file prior to visit.     Allergies  Allergen Reactions  . Codeine Hives     ROS: Review of Systems As above.  PHYSICAL EXAM: BP 129/87   Pulse 73   Temp 97.8 F (36.6 C) (Oral)   Resp 16   Wt 271 lb (122.9 kg)   SpO2 95%   BMI 43.74 kg/m   Physical Exam  General appearance - alert, well appearing, and in no distress Mental status - alert, oriented to person, place, and time, normal mood, behavior, speech, dress, motor activity, and thought processes Abdomen:  Slight suprapubic tenderness Pelvic - CMA Pollock present. normal external genitalia, vulva, vagina, cervix, uterus and adnexa.  No CMT  Results for orders placed or performed in visit on 11/02/17  POCT urinalysis dipstick  Result Value Ref Range   Color, UA yellow    Clarity, UA clear    Glucose, UA negative    Bilirubin, UA negative    Ketones, UA trace    Spec Grav, UA 1.025 1.010 - 1.025   Blood, UA negative    pH, UA 6.0 5.0 - 8.0   Protein, UA negative    Urobilinogen, UA 0.2 0.2 or 1.0 E.U./dL   Nitrite, UA negative    Leukocytes, UA Negative Negative   Appearance     Odor      ASSESSMENT AND PLAN: 1. Vaginal discharge Pt will be notified of results  - Cervicovaginal ancillary only  2. Dysuria UA negative for UTI - POCT urinalysis dipstick  Patient was given the opportunity to ask  questions.  Patient verbalized understanding of the plan and was able to repeat key elements of the plan.   Orders Placed This Encounter  Procedures  . POCT urinalysis dipstick     Requested Prescriptions    No prescriptions requested or ordered in this encounter    Future Appointments  Date Time Provider Department Center  11/23/2017 10:30 AM Hoy Register, MD [id number] None    Jonah Blue, MD, [facility]

## 2017-11-03 LAB — CERVICOVAGINAL ANCILLARY ONLY
Bacterial vaginitis: NEGATIVE
CANDIDA VAGINITIS: NEGATIVE
CHLAMYDIA, DNA PROBE: NEGATIVE
Neisseria Gonorrhea: NEGATIVE
TRICH (WINDOWPATH): NEGATIVE

## 2017-11-06 ENCOUNTER — Telehealth: Payer: Self-pay

## 2017-11-06 NOTE — Telephone Encounter (Signed)
Contacted pt to go over V. Swab pt is aware and doesn't have any questions or concerns

## 2017-11-23 ENCOUNTER — Ambulatory Visit: Payer: Self-pay | Attending: Family Medicine | Admitting: Family Medicine

## 2017-11-23 ENCOUNTER — Encounter: Payer: Self-pay | Admitting: Family Medicine

## 2017-11-23 VITALS — BP 117/80 | HR 86 | Temp 97.5°F | Ht 66.0 in | Wt 267.0 lb

## 2017-11-23 DIAGNOSIS — K2100 Gastro-esophageal reflux disease with esophagitis, without bleeding: Secondary | ICD-10-CM

## 2017-11-23 DIAGNOSIS — Z9889 Other specified postprocedural states: Secondary | ICD-10-CM | POA: Insufficient documentation

## 2017-11-23 DIAGNOSIS — Z885 Allergy status to narcotic agent status: Secondary | ICD-10-CM | POA: Insufficient documentation

## 2017-11-23 DIAGNOSIS — M5136 Other intervertebral disc degeneration, lumbar region: Secondary | ICD-10-CM | POA: Insufficient documentation

## 2017-11-23 DIAGNOSIS — Z79899 Other long term (current) drug therapy: Secondary | ICD-10-CM | POA: Insufficient documentation

## 2017-11-23 DIAGNOSIS — F3177 Bipolar disorder, in partial remission, most recent episode mixed: Secondary | ICD-10-CM | POA: Insufficient documentation

## 2017-11-23 MED ORDER — KETOROLAC TROMETHAMINE 60 MG/2ML IM SOLN
60.0000 mg | Freq: Once | INTRAMUSCULAR | Status: AC
  Administered 2017-11-23: 60 mg via INTRAMUSCULAR

## 2017-11-23 MED ORDER — CYCLOBENZAPRINE HCL 10 MG PO TABS: 10.0000 mg | ORAL_TABLET | Freq: Two times a day (BID) | ORAL | 3 refills | Status: DC | PRN

## 2017-11-23 MED ORDER — TRAMADOL HCL 50 MG PO TABS: 50.0000 mg | ORAL_TABLET | Freq: Three times a day (TID) | ORAL | 1 refills | Status: DC | PRN

## 2017-11-23 MED ORDER — GABAPENTIN 300 MG PO CAPS: 300.0000 mg | ORAL_CAPSULE | Freq: Two times a day (BID) | ORAL | 3 refills | Status: AC

## 2017-11-23 MED ORDER — OMEPRAZOLE 20 MG PO CPDR: 20.0000 mg | DELAYED_RELEASE_CAPSULE | Freq: Every day | ORAL | 3 refills | Status: DC

## 2017-11-23 MED ORDER — MELOXICAM 7.5 MG PO TABS: 7.5000 mg | ORAL_TABLET | Freq: Every day | ORAL | 3 refills | Status: DC

## 2017-11-23 MED ORDER — PROMETHAZINE HCL 25 MG PO TABS: 25.0000 mg | ORAL_TABLET | Freq: Three times a day (TID) | ORAL | 1 refills | Status: DC | PRN

## 2017-11-23 MED FILL — OMEPRAZOLE DR 20 MG CAPSULE: 20 | 30 days supply | Qty: 30 | Fill #0

## 2017-11-23 MED FILL — GABAPENTIN 300 MG CAPSULE: 300 | 30 days supply | Qty: 60 | Fill #0

## 2017-11-23 MED FILL — MELOXICAM 7.5 MG TABLET: 7.5 | 30 days supply | Qty: 30 | Fill #0

## 2017-11-23 MED FILL — PROMETHAZINE 25 MG TABLET: 25 | 20 days supply | Qty: 60 | Fill #0

## 2017-11-23 MED FILL — CYCLOBENZAPRINE 10 MG TAB: 10 | 30 days supply | Qty: 60 | Fill #0

## 2017-11-23 MED FILL — traMADol HCL 50 MG TABS: 50 | 20 days supply | Qty: 60 | Fill #0

## 2017-11-23 NOTE — Patient Instructions (Signed)

## 2017-11-23 NOTE — Progress Notes (Signed)
Subjective:  Patient ID: Vanessa Wood, female    DOB: 07/16/1971  Age: 47 y.o. MRN: [id number]  CC: Back Pain and Shortness of Breath   HPI Vanessa Wood is a 47 year old female with a history of GERD, chronic back pain, bipolar disorder who presents today for a follow-up visit. She complains of severe low back pain which is associated with some muscle soreness in her lower extremities and feels like she has run a marathon.  Back pain does not radiate down her lower extremities and she is requesting replacing Robaxin with Flexeril as she did better on the latter in the past.  Also requests a referral to orthopedics. Lumbar spine x-ray from 09/2017 revealed findings below:  IMPRESSION: 1. Mild degenerative changes in the lower lumbar spine. Corresponding disc bulges noted at the L4-5 and L5-S1 levels on earlier CT abdomen of 06/27/2013. If clinical symptoms suggest radiculopathy, would consider lumbar spine MRI to exclude associated nerve root impingement. 2. Mild levoscoliosis. 3. No acute findings.  Her reflux symptoms are not controlled as she experiences daily nausea and vomiting which occur in the mornings.  Informs me she has had an EGD but is unable to recall the date and was told she has a hiatal hernia.  Symptoms are not controlled on omeprazole and she has to take promethazine for nausea and vomiting. Review of her chart indicates a negative Helicobacter pylori breath test from 12/2016. Her bipolar disorder is stable and she denies manic or depressive symptoms and has been compliant with her appointments at [facility].  Past Medical History:  Diagnosis Date  . Bipolar 1 disorder ([place])   . Diverticulitis   . H/O degenerative disc disease   . Major depressive disorder   . Sciatic leg pain     Past Surgical History:  Procedure Laterality Date  . EYE SURGERY  1985    Allergies  Allergen Reactions  . Codeine Hives     Outpatient Medications Prior to Visit    Medication Sig Dispense Refill  . albuterol (PROVENTIL HFA;VENTOLIN HFA) 108 (90 Base) MCG/ACT inhaler Inhale 1 puff into the lungs every 6 (six) hours as needed for wheezing or shortness of breath. 54 g 3  . traZODone (DESYREL) 50 MG tablet Take 50 mg by mouth at bedtime.    . gabapentin (NEURONTIN) 100 MG capsule Take 100 mg by mouth 3 (three) times daily.    . meloxicam (MOBIC) 7.5 MG tablet Take 1 tablet (7.5 mg total) by mouth daily. 30 tablet 3  . methocarbamol (ROBAXIN) 500 MG tablet Take 2 tablets (1,000 mg total) by mouth every 6 (six) hours as needed for muscle spasms. 120 tablet 3  . omeprazole (PRILOSEC) 20 MG capsule Take 1 capsule (20 mg total) by mouth daily. 30 capsule 3  . promethazine (PHENERGAN) 25 MG tablet Take 1 tablet (25 mg total) by mouth every 8 (eight) hours as needed for nausea or vomiting. 20 tablet 0  . traMADol (ULTRAM) 50 MG tablet Take 1 tablet (50 mg total) by mouth every 8 (eight) hours as needed. 30 tablet 0   No facility-administered medications prior to visit.     ROS Review of Systems  Constitutional: Negative for activity change, appetite change and fatigue.  HENT: Negative for congestion, sinus pressure and sore throat.   Eyes: Negative for visual disturbance.  Respiratory: Negative for cough, chest tightness, shortness of breath and wheezing.   Cardiovascular: Negative for chest pain and palpitations.  Gastrointestinal: Positive for nausea and  vomiting. Negative for abdominal distention, abdominal pain and constipation.  Endocrine: Negative for polydipsia.  Genitourinary: Negative for dysuria and frequency.  Musculoskeletal: Positive for back pain. Negative for arthralgias.  Skin: Negative for rash.  Neurological: Negative for tremors, light-headedness and numbness.  Hematological: Does not bruise/bleed easily.  Psychiatric/Behavioral: Negative for agitation and behavioral problems.    Objective:  BP 117/80   Pulse 86   Temp (!) 97.5 F  (36.4 C) (Oral)   Ht 5' 6"  (1.676 m)   Wt 267 lb (121.1 kg)   SpO2 93%   BMI 43.09 kg/m   BP/Weight 11/23/2017 11/02/2017 2/97/9892  Systolic BP 119 417 408  Diastolic BP 80 87 75  Wt. (Lbs) 267 271 268  BMI 43.09 43.74 43.26      Physical Exam  Constitutional: She is oriented to person, place, and time. She appears well-developed and well-nourished.  Cardiovascular: Normal rate, normal heart sounds and intact distal pulses.  No murmur heard. Pulmonary/Chest: Effort normal and breath sounds normal. She has no wheezes. She has no rales. She exhibits no tenderness.  Abdominal: Soft. Bowel sounds are normal. She exhibits no distension and no mass. There is no tenderness.  Musculoskeletal: Normal range of motion.  Neurological: She is alert and oriented to person, place, and time.  Psychiatric: She has a normal mood and affect.     Assessment & Plan:   1. Degenerative disc disease, lumbar Uncontrolled Toradol administered in the clinic Advised to apply heat - traMADol (ULTRAM) 50 MG tablet; Take 1 tablet (50 mg total) by mouth every 8 (eight) hours as needed.  Dispense: 60 tablet; Refill: 1 - meloxicam (MOBIC) 7.5 MG tablet; Take 1 tablet (7.5 mg total) by mouth daily.  Dispense: 30 tablet; Refill: 3 - cyclobenzaprine (FLEXERIL) 10 MG tablet; Take 1 tablet (10 mg total) by mouth 2 (two) times daily as needed for muscle spasms.  Dispense: 60 tablet; Refill: 3 - AMB referral to orthopedics - CMP14+EGFR - gabapentin (NEURONTIN) 300 MG capsule; Take 1 capsule (300 mg total) by mouth 2 (two) times daily.  Dispense: 60 capsule; Refill: 3 - ketorolac (TORADOL) injection 60 mg  2. Gastroesophageal reflux disease with esophagitis Uncontrolled  Helicobacter pylori was negative in 12/2016 She continues to experience vomiting which occur daily Referred to GI for possible endoscopy - promethazine (PHENERGAN) 25 MG tablet; Take 1 tablet (25 mg total) by mouth every 8 (eight) hours as needed  for nausea or vomiting.  Dispense: 60 tablet; Refill: 1 - omeprazole (PRILOSEC) 20 MG capsule; Take 1 capsule (20 mg total) by mouth daily.  Dispense: 30 capsule; Refill: 3 - Ambulatory referral to Gastroenterology  3. Bipolar disorder, in partial remission, most recent episode mixed ([place]) Continue trazodone Managed by mental health at [facility] ordered this encounter  Medications  . traMADol (ULTRAM) 50 MG tablet    Sig: Take 1 tablet (50 mg total) by mouth every 8 (eight) hours as needed.    Dispense:  60 tablet    Refill:  1  . promethazine (PHENERGAN) 25 MG tablet    Sig: Take 1 tablet (25 mg total) by mouth every 8 (eight) hours as needed for nausea or vomiting.    Dispense:  60 tablet    Refill:  1  . meloxicam (MOBIC) 7.5 MG tablet    Sig: Take 1 tablet (7.5 mg total) by mouth daily.    Dispense:  30 tablet    Refill:  3  . omeprazole (PRILOSEC) 20  MG capsule    Sig: Take 1 capsule (20 mg total) by mouth daily.    Dispense:  30 capsule    Refill:  3  . cyclobenzaprine (FLEXERIL) 10 MG tablet    Sig: Take 1 tablet (10 mg total) by mouth 2 (two) times daily as needed for muscle spasms.    Dispense:  60 tablet    Refill:  3  . gabapentin (NEURONTIN) 300 MG capsule    Sig: Take 1 capsule (300 mg total) by mouth 2 (two) times daily.    Dispense:  60 capsule    Refill:  3  . ketorolac (TORADOL) injection 60 mg    Follow-up: Return in about 3 months (around 02/22/2018) for Follow-up of chronic medical conditions.   Charlott Rakes MD

## 2017-11-23 NOTE — Progress Notes (Signed)
Patient would like a referral to ortho for back pain.

## 2017-11-24 ENCOUNTER — Encounter: Payer: Self-pay | Admitting: Gastroenterology

## 2017-11-24 LAB — CMP14+EGFR
A/G RATIO: 1.5 (ref 1.2–2.2)
ALK PHOS: 85 IU/L (ref 39–117)
ALT: 33 IU/L — ABNORMAL HIGH (ref 0–32)
AST: 27 IU/L (ref 0–40)
Albumin: 4.1 g/dL (ref 3.5–5.5)
BILIRUBIN TOTAL: 0.3 mg/dL (ref 0.0–1.2)
BUN/Creatinine Ratio: 12 (ref 9–23)
BUN: 9 mg/dL (ref 6–24)
CALCIUM: 9.3 mg/dL (ref 8.7–10.2)
CHLORIDE: 105 mmol/L (ref 96–106)
CO2: 22 mmol/L (ref 20–29)
Creatinine, Ser: 0.75 mg/dL (ref 0.57–1.00)
GFR calc Af Amer: 111 mL/min/{1.73_m2} (ref >59)
GFR calc non Af Amer: 97 mL/min/{1.73_m2} (ref >59)
Globulin, Total: 2.8 g/dL (ref 1.5–4.5)
Glucose: 107 mg/dL — ABNORMAL HIGH (ref 65–99)
POTASSIUM: 4.7 mmol/L (ref 3.5–5.2)
SODIUM: 140 mmol/L (ref 134–144)
Total Protein: 6.9 g/dL (ref 6.0–8.5)

## 2017-11-30 ENCOUNTER — Encounter (INDEPENDENT_AMBULATORY_CARE_PROVIDER_SITE_OTHER): Payer: Self-pay | Admitting: Surgery

## 2017-11-30 ENCOUNTER — Telehealth: Payer: Self-pay

## 2017-11-30 ENCOUNTER — Ambulatory Visit (INDEPENDENT_AMBULATORY_CARE_PROVIDER_SITE_OTHER): Payer: Self-pay | Admitting: Surgery

## 2017-11-30 VITALS — BP 121/85 | HR 81 | Ht 64.0 in | Wt 268.0 lb

## 2017-11-30 DIAGNOSIS — M5416 Radiculopathy, lumbar region: Secondary | ICD-10-CM

## 2017-11-30 NOTE — Progress Notes (Signed)
Office Visit Note   Patient: Vanessa Wood           Date of Birth: [birth year]           MRN: [id number] Visit Date: 11/30/2017              Requested by: Hoy Register, MD [address] [place], [place] [address] PCP: Hoy Register, MD   Assessment & Plan: Visit Diagnoses:  1. Radiculopathy, lumbar region     Plan: With patient's ongoing symptoms and failed conservative treatment I will schedule lumbar spine MRI to rule out HNP/stenosis and this can be compared to previous study that was done in 2010.  Can continue medication management from primary care physician since they have been doing this.  Follow-up with Dr. Otelia Sergeant after completion of her scan to discuss results and further treatment options.  Follow-Up Instructions: Return in about 3 weeks (around 12/21/2017) for With Dr. Otelia Sergeant to review MRI lumbar.   Orders:  Orders Placed This Encounter  Procedures  . MR Lumbar Spine w/o contrast   No orders of the defined types were placed in this encounter.     Procedures: No procedures performed   Clinical Data: No additional findings.   Subjective: Chief Complaint  Patient presents with  . Lower Back - Pain    HPI 47 year old white female comes in today with complaints of worsening low back pain and right lower extremity radiculopathy.  She has had off-and-on problems with her low back for several years.  Previous lumbar MRI scan May 14, 2009 and that showed L4-5 mild disc desiccation.  Mild bulge/small broad-based central disc protrusion minimally more notable left paracentral position.  Minimal indentation of the ventral aspect of thecal sac.  Minimal facet joint degenerative changes.  L5-S1 mild disc desiccation.  Mild bulge.  Small central disc protrusion minimally more notable to the left contributing to mild left lateral recess stenosis and slight crowding of the left S1 nerve root.  Patient states that she had insurance issues and did not return to  discuss treatment options.  Pain mostly radiates to the right thigh.  No symptoms on the left side.  No improvement with Flexeril, gabapentin, Mobic, tramadol.  Feels like symptoms are progressively getting worse.  No complaints of bowel or bladder incontinence. Review of Systems No current cardiac pulmonary GI GU issues  Objective: Vital Signs: BP 121/85 (BP Location: Left Arm, Patient Position: Sitting, Cuff Size: Large)   Pulse 81   Ht  (1.626 m)   Wt 268 lb (121.6 kg)   BMI 46.00 kg/m   Physical Exam  Constitutional: She is oriented to person, place, and time. No distress.  HENT:  Head: Normocephalic and atraumatic.  Eyes: Pupils are equal, round, and reactive to light. EOM are normal.  Neck: Normal range of motion.  Pulmonary/Chest: No respiratory distress.  Musculoskeletal:  Gait antalgic.  Decreased lumbar flexion extension.  Positive lumbar paraspinal tenderness.  Positive bilateral sciatic notch tenderness.  Negative straight leg raise.  No focal motor deficits.  Bilateral calves nontender.  Neurological: She is alert and oriented to person, place, and time.  Skin: Skin is warm and dry.  Psychiatric: She has a normal mood and affect.    Ortho Exam  Specialty Comments:  No specialty comments available.  Imaging: No results found.   PMFS History: Patient Active Problem List   Diagnosis Date Noted  . Frequent urination 12/01/2016  . Major depressive disorder   . Hematemesis  03/30/2012  . Night sweats 03/30/2012  . Nausea & vomiting 03/30/2012  . Rhinorrhea 11/15/2011  . Id reaction 04/26/2011  . Fungal infection 04/11/2011  . POTENTIAL CARIES 04/20/2010  . UNSPECIFIED VAGINITIS AND VULVOVAGINITIS 04/20/2010  . ELBOW PAIN, RIGHT 12/11/2009  . OLECRANON BURSITIS, RIGHT 12/11/2009  . SORE THROAT 11/13/2009  . BACK PAIN, CHRONIC 10/30/2009  . ANKLE SPRAIN, LEFT 08/13/2009  . DISC DISEASE, LUMBOSACRAL SPINE 05/18/2009  . SPINAL STENOSIS OF LUMBAR REGION  04/03/2009  . OBESITY, UNSPECIFIED 03/24/2009  . GERD 02/13/2009  . SHOULDER PAIN, LEFT 02/13/2009  . DYSPNEA ON EXERTION 02/13/2009  . SCIATICA 11/18/2008  . BIPOLAR DISORDER UNSPECIFIED 09/02/2008  . ANXIETY STATE, UNSPECIFIED 09/02/2008  . 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 TOBACCO ABUSE 09/02/2008  . SPRAIN AND STRAIN OF LUMBOSACRAL 09/02/2008   Past Medical History:  Diagnosis Date  . Bipolar 1 disorder ([facility])   . Diverticulitis   . H/O degenerative disc disease   . Major depressive disorder   . Sciatic leg pain     Family History  Problem Relation Age of Onset  . Cancer Other   . CAD Other   . Emphysema Other     Past Surgical History:  Procedure Laterality Date  . EYE SURGERY  1985   Social History   Occupational History  . Not on file  Tobacco Use  . Smoking status: Current Every Day Smoker    Packs/day: 0.50    Years: 31.00    Pack years: 15.50    Types: Cigarettes  . Smokeless tobacco: Never Used  . Tobacco comment: decrease smoking to 0.5 ppd  Substance and Sexual Activity  . Alcohol use: Yes    Comment: quit 3 months ago  . Drug use: Yes    Types: Marijuana, Cocaine    Comment: marijuana- yesterday   cocaine- 6 months ago  . Sexual activity: Yes    Birth control/protection: None

## 2017-11-30 NOTE — Telephone Encounter (Signed)
Patient was called and informed of lab results. 

## 2017-12-24 ENCOUNTER — Ambulatory Visit
Admission: RE | Admit: 2017-12-24 | Discharge: 2017-12-24 | Disposition: A | Discharge status: Home / Self Care | Payer: Self-pay | Source: Ambulatory Visit | Attending: Surgery | Admitting: Surgery

## 2017-12-24 DIAGNOSIS — M5416 Radiculopathy, lumbar region: Secondary | ICD-10-CM

## 2017-12-27 MED FILL — $VENTOLIN HFA 18G INHALER: 108 (90 BAS | 25 days supply | Qty: 18 | Fill #1

## 2017-12-27 MED FILL — CYCLOBENZAPRINE 10 MG TAB: 10 | 30 days supply | Qty: 60 | Fill #1

## 2017-12-27 MED FILL — GABAPENTIN 300 MG CAPSULE: 300 | 30 days supply | Qty: 60 | Fill #1

## 2017-12-27 MED FILL — ?OMEPRAZOLE DR 20MG CAPSULE: 20 | 30 days supply | Qty: 30 | Fill #1

## 2017-12-28 ENCOUNTER — Ambulatory Visit (INDEPENDENT_AMBULATORY_CARE_PROVIDER_SITE_OTHER): Payer: Self-pay | Admitting: Specialist

## 2017-12-28 ENCOUNTER — Ambulatory Visit (INDEPENDENT_AMBULATORY_CARE_PROVIDER_SITE_OTHER): Payer: Self-pay

## 2017-12-28 ENCOUNTER — Encounter (INDEPENDENT_AMBULATORY_CARE_PROVIDER_SITE_OTHER): Payer: Self-pay | Admitting: Specialist

## 2017-12-28 VITALS — BP 120/76 | HR 92 | Ht 64.0 in | Wt 268.0 lb

## 2017-12-28 DIAGNOSIS — M48062 Spinal stenosis, lumbar region with neurogenic claudication: Secondary | ICD-10-CM

## 2017-12-28 DIAGNOSIS — M5136 Other intervertebral disc degeneration, lumbar region: Secondary | ICD-10-CM

## 2017-12-28 DIAGNOSIS — M5416 Radiculopathy, lumbar region: Secondary | ICD-10-CM

## 2017-12-28 MED ORDER — TRAMADOL HCL 50 MG PO TABS: 50.0000 mg | ORAL_TABLET | Freq: Two times a day (BID) | ORAL | 0 refills | Status: AC | PRN

## 2017-12-28 MED ORDER — DICLOFENAC SODIUM 50 MG PO TBEC: 50.0000 mg | DELAYED_RELEASE_TABLET | Freq: Two times a day (BID) | ORAL | 3 refills | Status: DC

## 2017-12-28 MED FILL — traMADol HCL 50 MG TABS: 50 | 15 days supply | Qty: 30 | Fill #0

## 2017-12-28 MED FILL — ?DICLOFENAC SOD DR 50 MG TA: 50 | 30 days supply | Qty: 60 | Fill #0

## 2017-12-28 NOTE — Patient Instructions (Signed)
Avoid bending, stooping and avoid lifting weights greater than 10 lbs. Avoid prolong standing and walking. Avoid frequent bending and stooping  No lifting greater than 10 lbs. May use ice or moist heat for pain. Weight loss is of benefit. Handicap license is recommended. Dr. [place] Blas secretary/Assistant will call to arrange for epidural steroid injection

## 2017-12-28 NOTE — Progress Notes (Signed)
Office Visit Note   Patient: Vanessa Wood           Date of Birth: Jun 09, 1971           MRN: [id number] Visit Date: 12/28/2017              Requested by: Hoy Register, MD [address] [place], [place] [address] PCP: Hoy Register, MD   Assessment & Plan: Visit Diagnoses:  1. Radiculopathy, lumbar region   2. Spinal stenosis of lumbar region with neurogenic claudication   3. Degenerative disc disease, lumbar     Plan: Avoid bending, stooping and avoid lifting weights greater than 10 lbs. Avoid prolong standing and walking. Avoid frequent bending and stooping  No lifting greater than 10 lbs. May use ice or moist heat for pain. Weight loss is of benefit. Handicap license is recommended. Dr. [place] Blas secretary/Assistant will call to arrange for epidural steroid injection   Follow-Up Instructions: Return in about 3 weeks (around 01/18/2018).   Orders:  Orders Placed This Encounter  Procedures  . XR Lumb Spine Flex&Ext Only   No orders of the defined types were placed in this encounter.     Procedures: No procedures performed   Clinical Data: No additional findings.   Subjective: Chief Complaint  Patient presents with  . Lower Back - Follow-up    MRI Review Lumbar    47 year old female with a several year history of back pain and radiation into the right groin. She report no specific injury till 2 years ago and then her dogs caused her to fall by run her over, chasing each other the yard and the dogs hit her from behind one weights 104 lbs. She reports her right leg bent Outwards at the right knee. She fell to the ground in the front yard. She had right knee pain and swelling and she reports she still feels pain in the right  Inner knee. No bowel or bladder difficulty, today her back pain is a "6" and does go as high as a "9". She reports she did not go to the emergency room, "I almost went, I didn't have any money." Eventually swelling improve, she has  pulling in the back of the right leg when she straightens the knee. There Is pain with flexion or with walking there is pain in the back of the right knee and back of the thigh. Some feet numbness especially into the right great toe for the past 2 years. I can not walk to the corner about a half a block, her mother does the shopping for about 3 years since she moved into her current house. She reports she was homeless before her current house. She visits her boyfriend in [facility], he has been there a month and  he is on oxygen. Trouble standing in one place, she leans on carts at the grocery store and also when doing dishes has to lean on the sink. No bowel or bladder difficulty. Not able to do much. 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 Smokes 1/2 pack per day.  Takes motrin and naprosyn and meloxicam and tramadol for the pain. She takes it twice a day.    Review of Systems  Constitutional: Positive for activity change and fatigue. Negative for appetite change, chills, diaphoresis, fever and unexpected weight change.  HENT: Positive for sore throat. Negative for congestion, dental problem, drooling, ear discharge, ear pain, facial swelling, hearing  loss, mouth sores, nosebleeds, postnasal drip, rhinorrhea, sinus pressure, sinus pain, sneezing, tinnitus, trouble swallowing and voice change.   Eyes: Positive for visual disturbance. Negative for photophobia, pain, discharge, redness and itching.  Respiratory: Positive for cough, chest tightness, shortness of breath and wheezing. Negative for apnea, choking and stridor.   Cardiovascular: Positive for chest pain and leg swelling. Negative for palpitations.  Gastrointestinal: Positive for abdominal pain, nausea and vomiting. Negative for abdominal distention, anal bleeding, blood in stool, constipation, diarrhea and rectal pain.  Endocrine: Negative for cold intolerance, heat intolerance, polydipsia,  polyphagia and polyuria.  Genitourinary: Negative for decreased urine volume, difficulty urinating, dyspareunia, dysuria, enuresis, flank pain, frequency, genital sores, hematuria, menstrual problem, pelvic pain, urgency, vaginal bleeding, vaginal discharge and vaginal pain.  Musculoskeletal: Positive for back pain, gait problem and joint swelling. Negative for arthralgias, myalgias, neck pain and neck stiffness.  Skin: Negative for color change, pallor, rash and wound.  Allergic/Immunologic: Positive for immunocompromised state. Negative for environmental allergies and food allergies.  Neurological: Positive for dizziness, weakness, numbness and headaches. Negative for tremors, seizures, syncope, speech difficulty and light-headedness.  Hematological: Negative for adenopathy. Does not bruise/bleed easily.  Psychiatric/Behavioral: Positive for decreased concentration and suicidal ideas. Negative for agitation, behavioral problems, confusion, dysphoric mood, hallucinations, self-injury and sleep disturbance. The patient is not nervous/anxious and is not hyperactive.      Objective: Vital Signs: BP 120/76 (BP Location: Left Arm, Patient Position: Sitting)   Pulse 92   Ht  (1.626 m)   Wt 268 lb (121.6 kg)   BMI 46.00 kg/m   Physical Exam  Back Exam   Tenderness  The patient is experiencing tenderness in the lumbar.  Range of Motion  Extension: abnormal  Flexion: normal  Lateral bend right: normal  Lateral bend left: normal  Rotation right: normal  Rotation left: normal   Muscle Strength  Right Quadriceps:  4/5  Left Quadriceps:  5/5  Right Hamstrings:  5/5  Left Hamstrings:  5/5   Tests  Straight leg raise right: negative Straight leg raise left: negative  Reflexes  Babinski's sign: normal   Other  Heel walk: abnormal Sensation: decreased Gait: normal  Erythema: no back redness      Specialty Comments:  No specialty comments available.  Imaging: No  results found.   PMFS History: Patient Active Problem List   Diagnosis Date Noted  . Frequent urination 12/01/2016  . Major depressive disorder   . Hematemesis 03/30/2012  . Night sweats 03/30/2012  . Nausea & vomiting 03/30/2012  . Rhinorrhea 11/15/2011  . Id reaction 04/26/2011  . Fungal infection 04/11/2011  . POTENTIAL CARIES 04/20/2010  . UNSPECIFIED VAGINITIS AND VULVOVAGINITIS 04/20/2010  . ELBOW PAIN, RIGHT 12/11/2009  . OLECRANON BURSITIS, RIGHT 12/11/2009  . SORE THROAT 11/13/2009  . BACK PAIN, CHRONIC 10/30/2009  . ANKLE SPRAIN, LEFT 08/13/2009  . DISC DISEASE, LUMBOSACRAL SPINE 05/18/2009  . SPINAL STENOSIS OF LUMBAR REGION 04/03/2009  . OBESITY, UNSPECIFIED 03/24/2009  . GERD 02/13/2009  . SHOULDER PAIN, LEFT 02/13/2009  . DYSPNEA ON EXERTION 02/13/2009  . SCIATICA 11/18/2008  . BIPOLAR DISORDER UNSPECIFIED 09/02/2008  . ANXIETY STATE, UNSPECIFIED 09/02/2008  . 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 TOBACCO ABUSE 09/02/2008  . SPRAIN AND STRAIN OF LUMBOSACRAL 09/02/2008   Past Medical History:  Diagnosis Date  . Bipolar 1 disorder (HCC)   . Diverticulitis   . H/O degenerative disc disease   . Major depressive disorder   . Sciatic leg pain  Family History  Problem Relation Age of Onset  . Cancer Other   . CAD Other   . Emphysema Other     Past Surgical History:  Procedure Laterality Date  . EYE SURGERY  1985   Social History   Occupational History  . Not on file  Tobacco Use  . Smoking status: Current Every Day Smoker    Packs/day: 0.50    Years: 31.00    Pack years: 15.50    Types: Cigarettes  . Smokeless tobacco: Never Used  . Tobacco comment: decrease smoking to 0.5 ppd  Substance and Sexual Activity  . Alcohol use: Yes    Comment: quit 3 months ago  . Drug use: Yes    Types: Marijuana, Cocaine    Comment: marijuana- yesterday   cocaine- 6 months ago  . Sexual activity: Yes    Birth control/protection: None

## 2018-01-12 ENCOUNTER — Ambulatory Visit: Payer: Self-pay | Admitting: Gastroenterology

## 2018-01-22 ENCOUNTER — Encounter (INDEPENDENT_AMBULATORY_CARE_PROVIDER_SITE_OTHER): Payer: Self-pay | Admitting: Physical Medicine and Rehabilitation

## 2018-01-22 ENCOUNTER — Ambulatory Visit (INDEPENDENT_AMBULATORY_CARE_PROVIDER_SITE_OTHER): Payer: Self-pay | Admitting: Physical Medicine and Rehabilitation

## 2018-01-22 ENCOUNTER — Ambulatory Visit (INDEPENDENT_AMBULATORY_CARE_PROVIDER_SITE_OTHER): Payer: Self-pay

## 2018-01-22 VITALS — BP 146/92 | HR 74

## 2018-01-22 DIAGNOSIS — M5416 Radiculopathy, lumbar region: Secondary | ICD-10-CM

## 2018-01-22 MED ORDER — METHYLPREDNISOLONE ACETATE 80 MG/ML IJ SUSP
80.0000 mg | Freq: Once | INTRAMUSCULAR | Status: AC
  Administered 2018-01-22: 80 mg

## 2018-01-22 NOTE — Progress Notes (Signed)
Vanessa Wood - 47 y.o. female MRN [id number]  Date of birth: February 10, 1971  Office Visit Note: Visit Date: 01/22/2018 PCP: Hoy Register, MD Referred by: Hoy Register, MD  Subjective: Chief Complaint  Patient presents with  . Lower Back - Pain  . Right Leg - Pain   HPI: Vanessa Wood is a 47 year old female with predominantly low back and right hip and leg pain.  She has lateral recess narrowing at L4-5.  She comes in today at the request of Dr. Vira Browns for right L4-5 interlaminar epidural steroid injection diagnostically hopefully therapeutically.   ROS Otherwise per HPI.  Assessment & Plan: Visit Diagnoses:  1. Lumbar radiculopathy     Plan: No additional findings.   Meds & Orders:  Meds ordered this encounter  Medications  . methylPREDNISolone acetate (DEPO-MEDROL) injection 80 mg    Orders Placed This Encounter  Procedures  . XR C-ARM NO REPORT  . Epidural Steroid injection    Follow-up: Return if symptoms worsen or fail to improve, for Dr. Otelia Sergeant.   Procedures: No procedures performed  Lumbar Epidural Steroid Injection - Interlaminar Approach with Fluoroscopic Guidance  Patient: Vanessa Wood      Date of Birth: [birth year] MRN: [id number] PCP: Hoy Register, MD      Visit Date: 01/22/2018   Universal Protocol:     Consent Given By: the patient  Position: PRONE  Additional Comments: Vital signs were monitored before and after the procedure. Patient was prepped and draped in the usual sterile fashion. The correct patient, procedure, and site was verified.   Injection Procedure Details:  Procedure Site One Meds Administered:  Meds ordered this encounter  Medications  . methylPREDNISolone acetate (DEPO-MEDROL) injection 80 mg     Laterality: Right  Location/Site:  L4-L5  Needle size: 20 G  Needle type: Tuohy  Needle Placement: Paramedian epidural  Findings:   -Comments: Excellent flow of contrast into the epidural  space.  Procedure Details: Using a paramedian approach from the side mentioned above, the region overlying the inferior lamina was localized under fluoroscopic visualization and the soft tissues overlying this structure were infiltrated with 4 ml. of 1% Lidocaine without Epinephrine. The Tuohy needle was inserted into the epidural space using a paramedian approach.   The epidural space was localized using loss of resistance along with lateral and bi-planar fluoroscopic views.  After negative aspirate for air, blood, and CSF, a 2 ml. volume of Isovue-250 was injected into the epidural space and the flow of contrast was observed. Radiographs were obtained for documentation purposes.    The injectate was administered into the level noted above.   Additional Comments:  The patient tolerated the procedure well Dressing: Band-Aid    Post-procedure details: Patient was observed during the procedure. Post-procedure instructions were reviewed.  Patient left the clinic in stable condition.   Clinical History: MRI LUMBAR SPINE WITHOUT CONTRAST  TECHNIQUE: Multiplanar, multisequence MR imaging of the lumbar spine was performed. No intravenous contrast was administered.  COMPARISON:  None.  FINDINGS: Segmentation: 5 non rib-bearing lumbar type vertebral bodies are present. The lowest fully formed vertebral body is L5.  Alignment: AP alignment is anatomic. Leftward curvature is centered at L4.  Vertebrae: Vertebral body heights are normal. Endplate marrow changes are present anteriorly at L5-S1. Marrow signal is otherwise normal.  Conus medullaris and cauda equina: Conus extends to the L1 level. Conus and cauda equina appear normal.  Paraspinal and other soft tissues: Limited imaging of the abdomen is unremarkable.  There is no significant adenopathy. Paraspinous musculature is within normal limits.  Disc levels:  L1-2: Negative.  L2-3: Negative.  L3-4: Mild facet  hypertrophy is present. Disc signal and morphology is preserved. There is no significant stenosis.  L4-5: A progressive leftward disc protrusion is present. Moderate left facet hypertrophy is noted. Progressive moderate left and mild right subarticular narrowing is present. Mild foraminal narrowing has progressed bilaterally as well.  L5-S1: A leftward disc protrusion has progressed since the prior exam. Moderate facet hypertrophy is again noted. Moderate left and mild right subarticular stenosis is present. Facet spurring contributes to mild foraminal narrowing bilaterally, left greater than right.  IMPRESSION: 1. Progressive leftward disc protrusions at L4-5 and L5-S1 with progressive moderate left subarticular stenosis at both levels. 2. Mild right subarticular narrowing at L4-5 and L5-S1 has also progressed. 3. Mild foraminal narrowing bilaterally at L4-5 and L5-S1 is worse left than right.   Electronically Signed   By: Marin Robertshristopher  Mattern M.D.   On: 12/24/2017 13:25   She reports that she has been smoking cigarettes.  She has a 15.50 pack-year smoking history. She has never used smokeless tobacco. No results for input(s): HGBA1C, LABURIC in the last 8760 hours.  Objective:  VS:  HT:    WT:   BMI:     BP:(!) 146/92  HR:74bpm  TEMP: ( )  RESP:  Physical Exam  Ortho Exam Imaging: No results found.  Past Medical/Family/Surgical/Social History: Medications & Allergies reviewed per EMR, new medications updated. Patient Active Problem List   Diagnosis Date Noted  . Frequent urination 12/01/2016  . Major depressive disorder   . Hematemesis 03/30/2012  . Night sweats 03/30/2012  . Nausea & vomiting 03/30/2012  . Rhinorrhea 11/15/2011  . Id reaction 04/26/2011  . Fungal infection 04/11/2011  . POTENTIAL CARIES 04/20/2010  . UNSPECIFIED VAGINITIS AND VULVOVAGINITIS 04/20/2010  . ELBOW PAIN, RIGHT 12/11/2009  . OLECRANON BURSITIS, RIGHT 12/11/2009  . SORE  THROAT 11/13/2009  . BACK PAIN, CHRONIC 10/30/2009  . ANKLE SPRAIN, LEFT 08/13/2009  . DISC DISEASE, LUMBOSACRAL SPINE 05/18/2009  . SPINAL STENOSIS OF LUMBAR REGION 04/03/2009  . OBESITY, UNSPECIFIED 03/24/2009  . GERD 02/13/2009  . SHOULDER PAIN, LEFT 02/13/2009  . DYSPNEA ON EXERTION 02/13/2009  . SCIATICA 11/18/2008  . BIPOLAR DISORDER UNSPECIFIED 09/02/2008  . ANXIETY STATE, UNSPECIFIED 09/02/2008  . 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 TOBACCO ABUSE 09/02/2008  . SPRAIN AND STRAIN OF LUMBOSACRAL 09/02/2008   Past Medical History:  Diagnosis Date  . Bipolar 1 disorder (HCC)   . Diverticulitis   . H/O degenerative disc disease   . Major depressive disorder   . Sciatic leg pain    Family History  Problem Relation Age of Onset  . Cancer Other   . CAD Other   . Emphysema Other    Past Surgical History:  Procedure Laterality Date  . EYE SURGERY  1985   Social History   Occupational History  . Not on file  Tobacco Use  . Smoking status: Current Every Day Smoker    Packs/day: 0.50    Years: 31.00    Pack years: 15.50    Types: Cigarettes  . Smokeless tobacco: Never Used  . Tobacco comment: decrease smoking to 0.5 ppd  Substance and Sexual Activity  . Alcohol use: Yes    Comment: quit 3 months ago  . Drug use: Yes    Types: Marijuana, Cocaine    Comment: marijuana- yesterday   cocaine- 6 months ago  .  Sexual activity: Yes    Birth control/protection: None

## 2018-01-22 NOTE — Procedures (Signed)
Lumbar Epidural Steroid Injection - Interlaminar Approach with Fluoroscopic Guidance  Patient: Vanessa BasquesSandra F Stgermaine      Date of Birth: 10/19/1970 MRN: [id number] PCP: Hoy RegisterNewlin, Enobong, MD      Visit Date: 01/22/2018   Universal Protocol:     Consent Given By: the patient  Position: PRONE  Additional Comments: Vital signs were monitored before and after the procedure. Patient was prepped and draped in the usual sterile fashion. The correct patient, procedure, and site was verified.   Injection Procedure Details:  Procedure Site One Meds Administered:  Meds ordered this encounter  Medications  . methylPREDNISolone acetate (DEPO-MEDROL) injection 80 mg     Laterality: Right  Location/Site:  L4-L5  Needle size: 20 G  Needle type: Tuohy  Needle Placement: Paramedian epidural  Findings:   -Comments: Excellent flow of contrast into the epidural space.  Procedure Details: Using a paramedian approach from the side mentioned above, the region overlying the inferior lamina was localized under fluoroscopic visualization and the soft tissues overlying this structure were infiltrated with 4 ml. of 1% Lidocaine without Epinephrine. The Tuohy needle was inserted into the epidural space using a paramedian approach.   The epidural space was localized using loss of resistance along with lateral and bi-planar fluoroscopic views.  After negative aspirate for air, blood, and CSF, a 2 ml. volume of Isovue-250 was injected into the epidural space and the flow of contrast was observed. Radiographs were obtained for documentation purposes.    The injectate was administered into the level noted above.   Additional Comments:  The patient tolerated the procedure well Dressing: Band-Aid    Post-procedure details: Patient was observed during the procedure. Post-procedure instructions were reviewed.  Patient left the clinic in stable condition.

## 2018-01-22 NOTE — Patient Instructions (Signed)

## 2018-01-22 NOTE — Progress Notes (Signed)
 .  Numeric Pain Rating Scale and Functional Assessment Average Pain 7   In the last MONTH (on 0-10 scale) has pain interfered with the following?  1. General activity like being  able to carry out your everyday physical activities such as walking, climbing stairs, carrying groceries, or moving a chair?  Rating(3)   +Driver, -BT, -Dye Allergies.  

## 2018-01-24 ENCOUNTER — Encounter: Payer: Self-pay | Admitting: Physician Assistant

## 2018-01-24 ENCOUNTER — Ambulatory Visit (INDEPENDENT_AMBULATORY_CARE_PROVIDER_SITE_OTHER): Payer: Self-pay | Admitting: Physician Assistant

## 2018-01-24 VITALS — BP 122/90 | HR 76 | Ht 65.0 in | Wt 262.4 lb

## 2018-01-24 DIAGNOSIS — R112 Nausea with vomiting, unspecified: Secondary | ICD-10-CM

## 2018-01-24 DIAGNOSIS — K219 Gastro-esophageal reflux disease without esophagitis: Secondary | ICD-10-CM

## 2018-01-24 MED ORDER — OMEPRAZOLE 40 MG PO CPDR: DELAYED_RELEASE_CAPSULE | ORAL | 6 refills | Status: DC

## 2018-01-24 MED FILL — OMEPRAZOLE DR 40 MG CAPSULE: 40 | 30 days supply | Qty: 60 | Fill #0

## 2018-01-24 NOTE — Progress Notes (Signed)
Assessment and plans review 

## 2018-01-24 NOTE — Progress Notes (Signed)
Subjective:    Patient ID: Vanessa Wood, female    DOB: 12/05/1970, 47 y.o.   MRN: [id number]  HPI Vanessa DavenportSandra is a pleasant 47 year old white female, new to GI today referred by Dr.Newlin/[facility] for evaluation of chronic GERD. Patient has history of chronic back pain, bipolar disorder, borderline personality, and asthma. She reports having prior upper endoscopy done many years ago in [place] but does not recall who did that procedure.  She was told that she had a hiatal hernia and also recalls remotely being told she had ulcers. She says over the past 8 or 9 years she has been sick almost every morning when she wakes up.  She says she feels nauseated, usually vomits and then feels okay the rest of the day.  She has heartburn generally after meals despite being on omeprazole.  She says sometimes this is bad and makes her nauseated, she also has intermittent chest burning and discomfort.  No dysphagia or odynophagia.  She generally is not awakened by heartburn. She has been on omeprazole 20 mg p.o. every morning over the past year and says that has not made much difference. She is a smoker, denies any regular EtOH.  She was just started on prescription Voltaren twice daily for back pain. No prior abdominal surgery Family history negative for colon cancer and polyps.   Review of Systems.Pertinent positive and negative review of systems were noted in the above HPI section.  All other review of systems was otherwise negative.  Outpatient Encounter Medications as of 01/24/2018  Medication Sig  . albuterol (PROVENTIL HFA;VENTOLIN HFA) 108 (90 Base) MCG/ACT inhaler Inhale 1 puff into the lungs every 6 (six) hours as needed for wheezing or shortness of breath.  . ARIPiprazole (ABILIFY) 9.75 MG/1.3ML injection Inject into the muscle every 30 (thirty) days.  . cyclobenzaprine (FLEXERIL) 10 MG tablet Take 1 tablet (10 mg total) by mouth 2 (two) times daily as needed for muscle  spasms.  . diclofenac (VOLTAREN) 50 MG EC tablet Take 1 tablet (50 mg total) by mouth 2 (two) times daily.  Marland Kitchen. gabapentin (NEURONTIN) 300 MG capsule Take 1 capsule (300 mg total) by mouth 2 (two) times daily.  Marland Kitchen. LITHIUM PO Take 1 tablet by mouth 2 (two) times daily.  Marland Kitchen. omeprazole (PRILOSEC) 20 MG capsule Take 1 capsule (20 mg total) by mouth daily.  . promethazine (PHENERGAN) 25 MG tablet Take 1 tablet (25 mg total) by mouth every 8 (eight) hours as needed for nausea or vomiting.  . traMADol (ULTRAM) 50 MG tablet Take 1 tablet (50 mg total) by mouth every 8 (eight) hours as needed.  . traZODone (DESYREL) 50 MG tablet Take 50 mg by mouth at bedtime.  . Venlafaxine HCl (EFFEXOR PO) Take 1 tablet by mouth daily.  Marland Kitchen. omeprazole (PRILOSEC) 40 MG capsule Take 1 tab twice daily.   No facility-administered encounter medications on file as of 01/24/2018.    Allergies  Allergen Reactions  . Codeine Hives   Patient Active Problem List   Diagnosis Date Noted  . Frequent urination 12/01/2016  . Major depressive disorder   . Hematemesis 03/30/2012  . Night sweats 03/30/2012  . Nausea & vomiting 03/30/2012  . Rhinorrhea 11/15/2011  . Id reaction 04/26/2011  . Fungal infection 04/11/2011  . POTENTIAL CARIES 04/20/2010  . UNSPECIFIED VAGINITIS AND VULVOVAGINITIS 04/20/2010  . ELBOW PAIN, RIGHT 12/11/2009  . OLECRANON BURSITIS, RIGHT 12/11/2009  . SORE THROAT 11/13/2009  . BACK PAIN, CHRONIC 10/30/2009  .  ANKLE SPRAIN, LEFT 08/13/2009  . DISC DISEASE, LUMBOSACRAL SPINE 05/18/2009  . SPINAL STENOSIS OF LUMBAR REGION 04/03/2009  . OBESITY, UNSPECIFIED 03/24/2009  . GERD 02/13/2009  . SHOULDER PAIN, LEFT 02/13/2009  . DYSPNEA ON EXERTION 02/13/2009  . SCIATICA 11/18/2008  . BIPOLAR DISORDER UNSPECIFIED 09/02/2008  . ANXIETY STATE, UNSPECIFIED 09/02/2008  . 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 TOBACCO ABUSE 09/02/2008  . SPRAIN AND STRAIN OF LUMBOSACRAL 09/02/2008   Social History    Socioeconomic History  . Marital status: Single    Spouse name: Not on file  . Number of children: 1  . Years of education: Not on file  . Highest education level: Not on file  Occupational History  . Occupation: disabled  Social Needs  . Financial resource strain: Not on file  . Food insecurity:    Worry: Not on file    Inability: Not on file  . Transportation needs:    Medical: Not on file    Non-medical: Not on file  Tobacco Use  . Smoking status: Current Every Day Smoker    Packs/day: 0.50    Years: 31.00    Pack years: 15.50    Types: Cigarettes  . Smokeless tobacco: Never Used  . Tobacco comment: decrease smoking to 0.5 ppd  Substance and Sexual Activity  . Alcohol use: Not Currently    Comment: quit 3 months ago  . Drug use: Yes    Types: Marijuana, Cocaine    Comment: marijuana- yesterday   cocaine- 6 months ago  . Sexual activity: Yes    Birth control/protection: None  Lifestyle  . Physical activity:    Days per week: Not on file    Minutes per session: Not on file  . Stress: Not on file  Relationships  . Social connections:    Talks on phone: Not on file    Gets together: Not on file    Attends religious service: Not on file    Active member of club or organization: Not on file    Attends meetings of clubs or organizations: Not on file    Relationship status: Not on file  . Intimate partner violence:    Fear of current or ex partner: Not on file    Emotionally abused: Not on file    Physically abused: Not on file    Forced sexual activity: Not on file  Other Topics Concern  . Not on file  Social History Narrative  . Not on file    Vanessa Wood's family history includes Cancer in her maternal grandmother; Emphysema in her maternal grandmother; Pancreatic cancer in her maternal grandfather.      Objective:    Vitals:   01/24/18 0824  BP: 122/90  Pulse: 76    Physical Exam; well-developed white female in no acute distress, blood pressure  122/90 pulse 76, height 5 foot 5, weight 262, BMI 43.6.  HEENT; nontraumatic normocephalic EOMI PERRLA sclera anicteric, Oropharynx benign, Cardiovascular; regular rate and rhythm with S1-S2 no murmur rub or gallop, Pulmonary ;scattered rhonchi, Abdomen ;obese, soft no focal tenderness, no palpable mass or hepatosplenomegaly bowel sounds are present, Rectal; exam not done, Extremities ;no clubbing cyanosis or edema skin warm and dry, Neuro psych; alert and oriented, grossly nonfocal mood and affect appropriate, affect flat       Assessment & Plan:   #49 47 year old white female with chronic GERD, poorly controlled on omeprazole 20 mg every morning.  She has daytime heartburn and indigestion, and almost every  morning awakens queasy, followed by an episode of vomiting after which she feels fine.   I think her symptoms are consistent with GERD.  With nocturnal component contributing to early a.m. queasiness-Rule out gallbladder disease #2 chronic back pain, on prescription pain medications #3 bipolar disorder #4.  Borderline personality  #5.  Obesity #6.  Smoker  Plan; We reviewed a strict antireflux regimen, including n.p.o. for 3 hours prior to bedtime and elevation of the head of the bed.  Also discussed quitting smoking as this is known to exacerbate reflux.  Weight loss will also be helpful.  Will change omeprazole to 40 mg p.o. AC breakfast and AC dinner to try to get symptoms under better control.  Schedule for upper abdominal ultrasound  Pt  signed a release and will try to get copy of her prior EGD She will follow-up in the office in 1 month with myself, and will be established with Dr. Leatha Gilding PA-C 01/24/2018   Cc: Hoy Register, MD

## 2018-01-24 NOTE — Patient Instructions (Signed)
We have sent the following medications to your pharmacy for you to pick up at your convenience: [facility] 1. Omeprazole 40 mg twice daily.  We have provided you with antireflux information. You have been scheduled for an abdominal ultrasound at [facility] on Mon 01-29-2018 at 10:00 am. Please arrive 15 minutes prior to your appointment for registration. Make certain not to have anything to eat or drink 6 hours prior to your appointment. Should you need to reschedule your appointment, please contact radiology at [phone]. This test typically takes about 30 minutes to perform.   If you are age 46 or younger, your body mass index should be between 19-25. Your Body mass index is 43.66 kg/m. If this is out of the aformentioned range listed, please consider follow up with your Primary Care Provider.

## 2018-01-29 ENCOUNTER — Ambulatory Visit (HOSPITAL_COMMUNITY)
Admission: RE | Admit: 2018-01-29 | Discharge: 2018-01-29 | Disposition: A | Discharge status: Home / Self Care | Payer: Self-pay | Source: Ambulatory Visit | Attending: Physician Assistant | Admitting: Physician Assistant

## 2018-01-29 DIAGNOSIS — R112 Nausea with vomiting, unspecified: Secondary | ICD-10-CM | POA: Insufficient documentation

## 2018-02-09 ENCOUNTER — Encounter (INDEPENDENT_AMBULATORY_CARE_PROVIDER_SITE_OTHER): Payer: Self-pay | Admitting: Specialist

## 2018-02-09 ENCOUNTER — Ambulatory Visit (INDEPENDENT_AMBULATORY_CARE_PROVIDER_SITE_OTHER): Payer: Self-pay | Admitting: Specialist

## 2018-02-09 VITALS — BP 130/73 | HR 57 | Ht 64.0 in | Wt 268.0 lb

## 2018-02-09 DIAGNOSIS — M47816 Spondylosis without myelopathy or radiculopathy, lumbar region: Secondary | ICD-10-CM

## 2018-02-09 DIAGNOSIS — M5136 Other intervertebral disc degeneration, lumbar region: Secondary | ICD-10-CM

## 2018-02-09 NOTE — Patient Instructions (Signed)
Avoid frequent bending and stooping  No lifting greater than 10 lbs. May use ice or moist heat for pain. Weight loss is of benefit. Handicap license is approved.  CBD oil tablets on line from [employer] Com may help to decrease pain and improve function. Walking in a swimming pool.

## 2018-02-09 NOTE — Progress Notes (Signed)
Office Visit Note   Patient: Vanessa Wood           Date of Birth: 03-10-1971           MRN: [id number] Visit Date: 02/09/2018              Requested by: Hoy Register, MD [address] [place], [place] [address] PCP: Hoy Register, MD   Assessment & Plan: Visit Diagnoses:  1. Degenerative disc disease, lumbar   2. Spondylosis without myelopathy or radiculopathy, lumbar region     Plan: Avoid frequent bending and stooping  No lifting greater than 10 lbs. May use ice or moist heat for pain. Weight loss is of benefit. Handicap license is approved. Diclofenac for arthritis and degenerative disc disease  CBD oil tablets on line from [employer] may help to decrease pain and improve function. Walking in a swimming pool.  Follow-Up Instructions: Return in about 3 months (around 05/12/2018).   Orders:  No orders of the defined types were placed in this encounter.  No orders of the defined types were placed in this encounter.     Procedures: No procedures performed   Clinical Data: No additional findings.   Subjective: Chief Complaint  Patient presents with  . Lower Back - Follow-up    She states injection hurt, "the pain has shifted to the left side now, the left side hurts more, the pain on the right is less now"    47 year old female with history of low back pain and radiation in the buttocks and thighs posteriors and not into the feet. Pain with bending, stooping, lifting and sitting.    Review of Systems  Constitutional: Negative.   HENT: Negative.   Eyes: Negative.   Respiratory: Negative.   Cardiovascular: Negative.   Gastrointestinal: Negative.   Endocrine: Negative.   Genitourinary: Negative.   Musculoskeletal: Negative.   Skin: Negative.   Allergic/Immunologic: Negative.   Neurological: Negative.   Hematological: Negative.   Psychiatric/Behavioral: Negative.      Objective: Vital Signs: BP 130/73 (BP Location: Left Arm, Patient  Position: Sitting)   Pulse (!) 57   Ht 5\' 4"  (1.626 m)   Wt 268 lb (121.6 kg)   BMI 46.00 kg/m   Physical Exam  Constitutional: She is oriented to person, place, and time. She appears well-developed and well-nourished.  HENT:  Head: Normocephalic and atraumatic.  Eyes: Pupils are equal, round, and reactive to light. EOM are normal.  Neck: Normal range of motion. Neck supple.  Pulmonary/Chest: Effort normal and breath sounds normal.  Abdominal: Soft. Bowel sounds are normal.  Neurological: She is alert and oriented to person, place, and time.  Skin: Skin is warm and dry.  Psychiatric: She has a normal mood and affect. Her behavior is normal. Judgment and thought content normal.    Back Exam   Tenderness  The patient is experiencing tenderness in the lumbar.  Range of Motion  Extension: abnormal  Flexion: abnormal  Lateral bend right: abnormal  Lateral bend left: abnormal  Rotation right: abnormal  Rotation left: abnormal   Muscle Strength  Right Quadriceps:  5/5  Left Quadriceps:  5/5  Right Hamstrings:  5/5  Left Hamstrings:  5/5   Tests  Straight leg raise right: negative Straight leg raise left: negative  Reflexes  Patellar: normal Achilles: normal Babinski's sign: normal   Other  Toe walk: normal Heel walk: normal Sensation: normal Gait: normal  Erythema: back redness Scars: present  Specialty Comments:  No specialty comments available.  Imaging: No results found.   PMFS History: Patient Active Problem List   Diagnosis Date Noted  . Frequent urination 12/01/2016  . Major depressive disorder   . Hematemesis 03/30/2012  . Night sweats 03/30/2012  . Nausea & vomiting 03/30/2012  . Rhinorrhea 11/15/2011  . Id reaction 04/26/2011  . Fungal infection 04/11/2011  . POTENTIAL CARIES 04/20/2010  . UNSPECIFIED VAGINITIS AND VULVOVAGINITIS 04/20/2010  . ELBOW PAIN, RIGHT 12/11/2009  . OLECRANON BURSITIS, RIGHT 12/11/2009  . SORE THROAT  11/13/2009  . BACK PAIN, CHRONIC 10/30/2009  . ANKLE SPRAIN, LEFT 08/13/2009  . DISC DISEASE, LUMBOSACRAL SPINE 05/18/2009  . SPINAL STENOSIS OF LUMBAR REGION 04/03/2009  . OBESITY, UNSPECIFIED 03/24/2009  . GERD 02/13/2009  . SHOULDER PAIN, LEFT 02/13/2009  . DYSPNEA ON EXERTION 02/13/2009  . SCIATICA 11/18/2008  . BIPOLAR DISORDER UNSPECIFIED 09/02/2008  . ANXIETY STATE, UNSPECIFIED 09/02/2008  . 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 TOBACCO ABUSE 09/02/2008  . SPRAIN AND STRAIN OF LUMBOSACRAL 09/02/2008   Past Medical History:  Diagnosis Date  . Anxiety   . Arthritis   . Asthma   . Bipolar 1 disorder (HCC)   . Diverticulitis   . GERD (gastroesophageal reflux disease)   . H/O degenerative disc disease   . Major depressive disorder   . Peptic ulcer disease   . Sciatic leg pain     Family History  Problem Relation Age of Onset  . Cancer Maternal Grandmother        type unknown  . Emphysema Maternal Grandmother   . Pancreatic cancer Maternal Grandfather     Past Surgical History:  Procedure Laterality Date  . RETINAL DETACHMENT SURGERY  1985   Social History   Occupational History  . Occupation: disabled  Tobacco Use  . Smoking status: Current Every Day Smoker    Packs/day: 0.50    Years: 31.00    Pack years: 15.50    Types: Cigarettes  . Smokeless tobacco: Never Used  . Tobacco comment: decrease smoking to 0.5 ppd  Substance and Sexual Activity  . Alcohol use: Not Currently    Comment: quit 3 months ago  . Drug use: Yes    Types: Marijuana, Cocaine    Comment: marijuana- yesterday   cocaine- 6 months ago  . Sexual activity: Yes    Birth control/protection: None

## 2018-02-26 ENCOUNTER — Ambulatory Visit: Payer: Self-pay | Admitting: Physician Assistant

## 2018-03-20 ENCOUNTER — Ambulatory Visit (INDEPENDENT_AMBULATORY_CARE_PROVIDER_SITE_OTHER): Payer: Self-pay | Admitting: Physician Assistant

## 2018-03-20 ENCOUNTER — Encounter: Payer: Self-pay | Admitting: Physician Assistant

## 2018-03-20 ENCOUNTER — Encounter (INDEPENDENT_AMBULATORY_CARE_PROVIDER_SITE_OTHER): Payer: Self-pay

## 2018-03-20 VITALS — BP 120/80 | HR 68 | Ht 66.0 in | Wt 267.0 lb

## 2018-03-20 DIAGNOSIS — K219 Gastro-esophageal reflux disease without esophagitis: Secondary | ICD-10-CM

## 2018-03-20 DIAGNOSIS — R112 Nausea with vomiting, unspecified: Secondary | ICD-10-CM

## 2018-03-20 MED ORDER — OMEPRAZOLE 20 MG PO CPDR: DELAYED_RELEASE_CAPSULE | ORAL | 3 refills | Status: DC

## 2018-03-20 MED ORDER — OMEPRAZOLE 40 MG PO CPDR: DELAYED_RELEASE_CAPSULE | ORAL | 0 refills | Status: DC

## 2018-03-20 MED ORDER — ONDANSETRON HCL 4 MG PO TABS: ORAL_TABLET | ORAL | 2 refills | Status: DC

## 2018-03-20 MED FILL — OMEPRAZOLE DR 40 MG CAPSULE: 40 | 30 days supply | Qty: 60 | Fill #1

## 2018-03-20 MED FILL — ONDANSETRON HCL 4 MG TABLET: 4 | 30 days supply | Qty: 30 | Fill #0

## 2018-03-20 NOTE — Progress Notes (Signed)
Subjective:    Patient ID: Vanessa Wood, female    DOB: 05/18/1971, 47 y.o.   MRN: [id number]  HPI Vanessa Wood is a 47 year old white female, now established with Dr. Marina GoodellPerry who I saw as a new patient on 01/24/2018 with complaints of chronic GERD and long-standing early morning nausea and frequent vomiting.  Patient has history of chronic back pain, bipolar disorder, borderline personality, obesity and tobacco abuse. She had been on omeprazole 20 mg daily which had not been controlling her symptoms well.  Interestingly her early morning nausea is generally alleviated by one episode of small-volume emesis and then she feels well for the rest of the day.  She is been having the symptoms for 8 or 9 years. We increased her omeprazole to 40 mg p.o. twice daily, and scheduled upper abdominal ultrasound which was done on 01/29/2018.  This was a negative exam.  She was also put on an antireflux regimen and diet.   She returns today stating that her heartburn and indigestion reflux symptoms are all much better on higher dose medication.  However she continues to have the early morning queasiness and frequently has a small amount of emesis first thing in the morning.  Sometimes she can eat saltines when she gets up and this alleviates the nausea other times she vomits and then she feels fine. She is on multiple medications which may be contributing to the early morning queasiness including Abilify, Neurontin, does well, and lithium.  She was also recently started on Voltaren twice daily for her back. She had reported previously that she had prior EGD and was told that she had a hiatal hernia.  We were unable to find any prior EGD in [place].  Review of Systems Pertinent positive and negative review of systems were noted in the above HPI section.  All other review of systems was otherwise negative.  Outpatient Encounter Medications as of 03/20/2018  Medication Sig  . albuterol (PROVENTIL HFA;VENTOLIN HFA) 108 (90  Base) MCG/ACT inhaler Inhale 1 puff into the lungs every 6 (six) hours as needed for wheezing or shortness of breath.  . ARIPiprazole (ABILIFY) 9.75 MG/1.3ML injection Inject into the muscle every 30 (thirty) days.  . cyclobenzaprine (FLEXERIL) 10 MG tablet Take 1 tablet (10 mg total) by mouth 2 (two) times daily as needed for muscle spasms.  . diclofenac (VOLTAREN) 50 MG EC tablet Take 1 tablet (50 mg total) by mouth 2 (two) times daily.  Marland Kitchen. gabapentin (NEURONTIN) 300 MG capsule Take 1 capsule (300 mg total) by mouth 2 (two) times daily.  Marland Kitchen. LITHIUM PO Take 1 tablet by mouth 2 (two) times daily.  Marland Kitchen. omeprazole (PRILOSEC) 40 MG capsule Take 1 tab twice daily for one month.  . promethazine (PHENERGAN) 25 MG tablet Take 1 tablet (25 mg total) by mouth every 8 (eight) hours as needed for nausea or vomiting.  . traMADol (ULTRAM) 50 MG tablet Take 1 tablet (50 mg total) by mouth every 8 (eight) hours as needed.  . traZODone (DESYREL) 50 MG tablet Take 50 mg by mouth at bedtime.  . Venlafaxine HCl (EFFEXOR PO) Take 1 tablet by mouth daily.  . [DISCONTINUED] omeprazole (PRILOSEC) 40 MG capsule Take 1 tab twice daily.  Marland Kitchen. omeprazole (PRILOSEC) 20 MG capsule Take 1 tablet by mouth twice daily.  . ondansetron (ZOFRAN) 4 MG tablet Take 1 tablet at bedtime daily.  . [DISCONTINUED] omeprazole (PRILOSEC) 20 MG capsule Take 1 capsule (20 mg total) by mouth daily.   No  facility-administered encounter medications on file as of 03/20/2018.    Allergies  Allergen Reactions  . Codeine Hives   Patient Active Problem List   Diagnosis Date Noted  . Frequent urination 12/01/2016  . Major depressive disorder   . Hematemesis 03/30/2012  . Night sweats 03/30/2012  . Nausea & vomiting 03/30/2012  . Rhinorrhea 11/15/2011  . Id reaction 04/26/2011  . Fungal infection 04/11/2011  . POTENTIAL CARIES 04/20/2010  . UNSPECIFIED VAGINITIS AND VULVOVAGINITIS 04/20/2010  . ELBOW PAIN, RIGHT 12/11/2009  . OLECRANON BURSITIS,  RIGHT 12/11/2009  . SORE THROAT 11/13/2009  . BACK PAIN, CHRONIC 10/30/2009  . ANKLE SPRAIN, LEFT 08/13/2009  . DISC DISEASE, LUMBOSACRAL SPINE 05/18/2009  . SPINAL STENOSIS OF LUMBAR REGION 04/03/2009  . OBESITY, UNSPECIFIED 03/24/2009  . GERD 02/13/2009  . SHOULDER PAIN, LEFT 02/13/2009  . DYSPNEA ON EXERTION 02/13/2009  . SCIATICA 11/18/2008  . BIPOLAR DISORDER UNSPECIFIED 09/02/2008  . ANXIETY STATE, UNSPECIFIED 09/02/2008  . 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 TOBACCO ABUSE 09/02/2008  . SPRAIN AND STRAIN OF LUMBOSACRAL 09/02/2008   Social History   Socioeconomic History  . Marital status: Single    Spouse name: Not on file  . Number of children: 2  . Years of education: Not on file  . Highest education level: Not on file  Occupational History  . Occupation: disabled  Social Needs  . Financial resource strain: Not on file  . Food insecurity:    Worry: Not on file    Inability: Not on file  . Transportation needs:    Medical: Not on file    Non-medical: Not on file  Tobacco Use  . Smoking status: Current Every Day Smoker    Packs/day: 0.50    Years: 31.00    Pack years: 15.50    Types: Cigarettes  . Smokeless tobacco: Never Used  . Tobacco comment: decrease smoking to 0.5 ppd  Substance and Sexual Activity  . Alcohol use: Not Currently    Comment: quit 3 months ago  . Drug use: Yes    Types: Marijuana, Cocaine    Comment: marijuana- yesterday   cocaine- 6 months ago  . Sexual activity: Yes    Birth control/protection: None  Lifestyle  . Physical activity:    Days per week: Not on file    Minutes per session: Not on file  . Stress: Not on file  Relationships  . Social connections:    Talks on phone: Not on file    Gets together: Not on file    Attends religious service: Not on file    Active member of club or organization: Not on file    Attends meetings of clubs or organizations: Not on file    Relationship status: Not on file  . Intimate  partner violence:    Fear of current or ex partner: Not on file    Emotionally abused: Not on file    Physically abused: Not on file    Forced sexual activity: Not on file  Other Topics Concern  . Not on file  Social History Narrative  . Not on file    Vanessa Wood family history includes COPD in her mother; Colon polyps in her mother; Depression in her mother; Diverticulosis in her mother; Emphysema in her maternal grandmother; Fibromyalgia in her mother; GER disease in her mother; Lung cancer in her maternal grandmother; Osteoporosis in her mother; Other in her daughter and mother; Pancreatic cancer in her maternal grandfather and maternal grandmother; Suicidality  in her father; Throat cancer in her maternal grandfather.      Objective:    Vitals:   03/20/18 0928  BP: 120/80  Pulse: 68    Physical Exam; well-developed obese white female in no acute distress, pleasant, accompanied by her mother height 5 foot 6, weight 267, BMI 43.0.  HEENT ;nontraumatic normocephalic EOMI PERRLA sclera anicteric oropharynx buccal mucosa moist, Cardiovascular; regular rate and rhythm with S1-S2 no murmur rub or gallop, Pulmonary; clear bilaterally, Abdomen; obese, soft ,no focal tenderness no guarding or rebound no palpable mass or hepatosplenomegaly bowel sounds are present, Rectal ,exam not done, Extremities ,no clubbing cyanosis or edema skin warm dry,  Neuro psych; alert and oriented grossly nonfocal mood and affect appropriate       Assessment & Plan:   #64 47 year old white female with chronic GERD-significant improvement in control with higher dose PPI therapy #2 long-standing early morning queasiness and frequent small-volume emesis.  Patient generally feels fine for the remainder of the day.  She is does not have any difficulty eating or drinking, no dysphagia or odynophagia, appetite is fine and she has not had any weight loss. Etiology is not entirely clear though suspect that this may be  related to combination of several  medications and GERD  #3 obesity #4.  Bipolar disorder #5.  Borderline personality #6.  Lumbar disc disease/chronic back pain  Plan; continue omeprazole 40 mg p.o. twice daily for 1 more month then decrease to 20 mg p.o. twice daily AC breakfast and AC dinner Will start trial of Zofran 4 mg p.o. Nightly Continue strict antireflux regimen We discussed EGD, she would like to try the Zofran first. Patient will follow-up with Dr. Marina Goodell or myself in 6 to 8 weeks.     Amy S Esterwood PA-C 03/20/2018   Cc: Hoy Register, MD

## 2018-03-20 NOTE — Patient Instructions (Signed)
We [facility] prescriptions [facility].  1. Zofran ( Ondansetron) 4 mg.  2. Prilosec ( Omeprazole)  40 mg for one month. 3. Prilosec  ( Omeprazole ) 20 mg twice daily , ongoing.   Try to follow a strict anti-reflux regimen.  Follow up with Mike GipAmy Esterwood PA as needed.

## 2018-03-20 NOTE — Progress Notes (Signed)
Assessment and plan reviewed 

## 2018-03-23 MED FILL — GABAPENTIN 300 MG CAPSULE: 300 | 30 days supply | Qty: 60 | Fill #2

## 2018-03-23 MED FILL — traMADol HCL 50 MG TABS: 50 | 20 days supply | Qty: 60 | Fill #1

## 2018-04-23 ENCOUNTER — Telehealth: Payer: Self-pay | Admitting: Physician Assistant

## 2018-04-23 NOTE — Telephone Encounter (Signed)
-  would ask her to stay on BID omeprazole , may have another rx for  Zofran , and yes we discussed EGD if no improvement  So please go ahead and schedule for EGD with Dr Marina GoodellPerry- thanks

## 2018-04-23 NOTE — Telephone Encounter (Signed)
Continues to have morning nausea. States this has become worse and the nausea lingers into the day. She is on omeprazole 40 mg BID and takes Zofran at bedtime. She asks about an EGD.

## 2018-04-24 NOTE — Telephone Encounter (Signed)
Left message to call. Need to schedule an EGD with Dr Marina GoodellPerry and the pre-visit. Had offered 05/03/18 but there are not any pre-visits this week. I did not hold any appointments.

## 2018-04-24 NOTE — Telephone Encounter (Signed)
Spoke with the patient. Appointments scheduled. Patient is advised and in agreement with this plan.

## 2018-04-30 ENCOUNTER — Other Ambulatory Visit: Payer: Self-pay

## 2018-04-30 ENCOUNTER — Ambulatory Visit (AMBULATORY_SURGERY_CENTER): Payer: Self-pay | Admitting: *Deleted

## 2018-04-30 VITALS — Ht 66.0 in | Wt 256.8 lb

## 2018-04-30 DIAGNOSIS — R112 Nausea with vomiting, unspecified: Secondary | ICD-10-CM

## 2018-04-30 NOTE — Progress Notes (Signed)
No egg or soy allergy known to patient  No issues with past sedation with any surgeries  or procedures, no intubation problems  No diet pills per patient No home 02 use per patient  No blood thinners per patient  Pt denies issues with constipation  No A fib or A flutter  EMMI video offered and declined by the patient. 

## 2018-05-09 ENCOUNTER — Encounter: Payer: Self-pay | Admitting: Internal Medicine

## 2018-05-09 ENCOUNTER — Ambulatory Visit (AMBULATORY_SURGERY_CENTER): Payer: Medicare Other | Admitting: Internal Medicine

## 2018-05-09 VITALS — BP 124/66 | HR 64 | Temp 96.0°F | Resp 15 | Ht 66.0 in | Wt 256.0 lb

## 2018-05-09 DIAGNOSIS — R112 Nausea with vomiting, unspecified: Secondary | ICD-10-CM

## 2018-05-09 DIAGNOSIS — K219 Gastro-esophageal reflux disease without esophagitis: Secondary | ICD-10-CM

## 2018-05-09 MED ORDER — SODIUM CHLORIDE 0.9 % IV SOLN: 500.0000 mL | Freq: Once | INTRAVENOUS | Status: DC

## 2018-05-09 NOTE — Progress Notes (Signed)
Pt's states no medical or surgical changes since previsit or office visit. 

## 2018-05-09 NOTE — Progress Notes (Signed)
Report to PACU, RN, vss, BBS= Clear.  

## 2018-05-09 NOTE — Op Note (Signed)
[facility] Patient Name: Vanessa Wood Procedure Date: 05/09/2018 10:20 AM MRN: [id number] Endoscopist: Wilhemina Bonito. Marina Goodell , MD Age: 47 Referring MD:  Date of Birth: 10/09/1970 Gender: Female Account #: [id number] Procedure:                Upper GI endoscopy Indications:              Esophageal reflux, Nausea with vomiting Medicines:                Monitored Anesthesia Care Procedure:                Pre-Anesthesia Assessment:                           - Prior to the procedure, a History and Physical                            was performed, and patient medications and                            allergies were reviewed. The patient's tolerance of                            previous anesthesia was also reviewed. The risks                            and benefits of the procedure and the sedation                            options and risks were discussed with the patient.                            All questions were answered, and informed consent                            was obtained. Prior Anticoagulants: The patient has                            taken no previous anticoagulant or antiplatelet                            agents. ASA Grade Assessment: II - A patient with                            mild systemic disease. After reviewing the risks                            and benefits, the patient was deemed in                            satisfactory condition to undergo the procedure.                           After obtaining informed consent, the endoscope was  passed under direct vision. Throughout the                            procedure, the patient's blood pressure, pulse, and                            oxygen saturations were monitored continuously. The                            Endoscope was introduced through the mouth, and                            advanced to the second part of duodenum. The upper                            GI endoscopy was  accomplished without difficulty.                            The patient tolerated the procedure well. Scope In: Scope Out: Findings:                 The esophagus revealed mild esophagitis at the                            gastroesophageal junction is manifested by several                            diminutive erosions and erythema.                           The stomach was normal.                           The examined duodenum was normal.                           The cardia and gastric fundus were normal on                            retroflexion. Complications:            No immediate complications. Estimated Blood Loss:     Estimated blood loss: none. Impression:               1. GERD with mild esophagitis                           2. Otherwise normal EGD                           3. Chronic nausea with intermittent vomiting no                            primary GI cause found. Suspect secondary to                            chronic cannabis use and/or medications.Marland Kitchen  Recommendation:           1. Continue medications for acid reflux                           2. Reflux precautions with attention to significant                            weight loss and discontinuation of smoking                           3. Stop smoking marijuana as this is a common cause                            for chronic nausea with vomiting                           4. Return to the care of your primary provider. Wilhemina Bonito. Marina Goodell, MD 05/09/2018 10:41:52 AM This report has been signed electronically.

## 2018-05-09 NOTE — Patient Instructions (Signed)
HANDOUT GIVEN FOR GERD  YOU HAD AN ENDOSCOPIC PROCEDURE TODAY AT THE [facility]:   Refer to the procedure report that was given to you for any specific questions about what was found during the examination.  If the procedure report does not answer your questions, please call your gastroenterologist to clarify.  If you requested that your care partner not be given the details of your procedure findings, then the procedure report has been included in a sealed envelope for you to review at your convenience later.  YOU SHOULD EXPECT: Some feelings of bloating in the abdomen. Passage of more gas than usual.  Walking can help get rid of the air that was put into your GI tract during the procedure and reduce the bloating. If you had a lower endoscopy (such as a colonoscopy or flexible sigmoidoscopy) you may notice spotting of blood in your stool or on the toilet paper. If you underwent a bowel prep for your procedure, you may not have a normal bowel movement for a few days.  Please Note:  You might notice some irritation and congestion in your nose or some drainage.  This is from the oxygen used during your procedure.  There is no need for concern and it should clear up in a day or so.  SYMPTOMS TO REPORT IMMEDIATELY:   Following upper endoscopy (EGD)  Vomiting of blood or coffee ground material  New chest pain or pain under the shoulder blades  Painful or persistently difficult swallowing  New shortness of breath  Fever of 100F or higher  Black, tarry-looking stools  For urgent or emergent issues, a gastroenterologist can be reached at any hour by calling [phone].   DIET:  We do recommend a small meal at first, but then you may proceed to your regular diet.  Drink plenty of fluids but you should avoid alcoholic beverages for 24 hours.  ACTIVITY:  You should plan to take it easy for the rest of today and you should NOT DRIVE or use heavy machinery until tomorrow (because of the  sedation medicines used during the test).    FOLLOW UP: Our staff will call the number listed on your records the next business day following your procedure to check on you and address any questions or concerns that you may have regarding the information given to you following your procedure. If we do not reach you, we will leave a message.  However, if you are feeling well and you are not experiencing any problems, there is no need to return our call.  We will assume that you have returned to your regular daily activities without incident.  If any biopsies were taken you will be contacted by phone or by letter within the next 1-3 weeks.  Please call us at [phone] if you have not heard about the biopsies in 3 weeks.    SIGNATURES/CONFIDENTIALITY: You and/or your care partner have signed paperwork which will be entered into your electronic medical record.  These signatures attest to the fact that that the information above on your After Visit Summary has been reviewed and is understood.  Full responsibility of the confidentiality of this discharge information lies with you and/or your care-partner. 

## 2018-05-10 ENCOUNTER — Telehealth: Payer: Self-pay

## 2018-05-10 NOTE — Telephone Encounter (Signed)
  Follow up Call-  Call back number 05/09/2018  Post procedure Call Back phone  # [phone]  Permission to leave phone message Yes  Some recent data might be hidden     Patient questions:  Do you have a fever, pain , or abdominal swelling? No. Pain Score  0 *  Have you tolerated food without any problems? Yes.    Have you been able to return to your normal activities? Yes.    Do you have any questions about your discharge instructions: Diet   No. Medications  No. Follow up visit  No.  Do you have questions or concerns about your Care? No.  Actions: * If pain score is 4 or above: No action needed, pain <4.

## 2018-07-30 ENCOUNTER — Telehealth (INDEPENDENT_AMBULATORY_CARE_PROVIDER_SITE_OTHER): Payer: Self-pay | Admitting: Specialist

## 2018-07-30 ENCOUNTER — Other Ambulatory Visit: Payer: Self-pay | Admitting: Family Medicine

## 2018-07-30 DIAGNOSIS — M5136 Other intervertebral disc degeneration, lumbar region: Secondary | ICD-10-CM

## 2018-07-30 DIAGNOSIS — M51369 Other intervertebral disc degeneration, lumbar region without mention of lumbar back pain or lower extremity pain: Secondary | ICD-10-CM

## 2018-07-30 MED ORDER — TRAMADOL HCL 50 MG PO TABS: 50.0000 mg | ORAL_TABLET | Freq: Three times a day (TID) | ORAL | 0 refills | Status: DC | PRN

## 2018-07-30 MED FILL — VENTOLIN HFA 90 MCG INHALER: 108 (90 BAS | 25 days supply | Qty: 18 | Fill #2

## 2018-07-30 MED FILL — CYCLOBENZAPRINE 10 MG TAB: 10 | 30 days supply | Qty: 60 | Fill #2

## 2018-07-30 MED FILL — OMEPRAZOLE DR 40 MG CAPSULE: 40 | 30 days supply | Qty: 60 | Fill #2

## 2018-07-30 NOTE — Telephone Encounter (Signed)
Medication refill  Tramadol

## 2018-07-30 NOTE — Telephone Encounter (Signed)
Pt last seen: 11/23/17 Next appt: n/a Last RX written on: 11/23/17 Date of original fill: 11/23/17 Date of refill(s): 03/23/18  The following controlled substances were also filled during this time: Tramadol 50mg  #30 tabs/15ds written by Dr. Otelia SergeantNitka on 12/28/17 and filled on 12/28/17   Please refill if appropriate.

## 2018-07-30 NOTE — Telephone Encounter (Signed)
Can you please advise for Dr. Nitka? 

## 2018-07-30 NOTE — Telephone Encounter (Signed)
Called to pharmacy. I called patient and advised,

## 2018-07-30 NOTE — Telephone Encounter (Signed)
OK - thanks

## 2018-08-02 ENCOUNTER — Telehealth (INDEPENDENT_AMBULATORY_CARE_PROVIDER_SITE_OTHER): Payer: Self-pay | Admitting: Specialist

## 2018-08-02 MED FILL — traMADol HCL 50 MG TABS: 50 | 7 days supply | Qty: 21 | Fill #0

## 2018-08-02 NOTE — Telephone Encounter (Signed)
This was denied and she has been sched for follow up appt 08/08/17 @ 915 with Fayrene Fearing

## 2018-08-02 NOTE — Telephone Encounter (Signed)
Looks like her insurance is requiring PA for tramadol, this has been submitted on [facility] [employer], showing as suspended, I will check back on the status of this, Confirmation # [id number].

## 2018-08-02 NOTE — Telephone Encounter (Signed)
Pt called saying that [facility] called and said that the medication that was Approved by Dr. Ophelia CharterYates was denied by the dr. So pt is wondering if she needs to make an apt to get a refill or what the next step is.

## 2018-08-08 ENCOUNTER — Ambulatory Visit (INDEPENDENT_AMBULATORY_CARE_PROVIDER_SITE_OTHER): Payer: Self-pay | Admitting: Surgery

## 2018-10-04 DIAGNOSIS — F314 Bipolar disorder, current episode depressed, severe, without psychotic features: Secondary | ICD-10-CM | POA: Diagnosis not present

## 2018-10-12 MED FILL — CYCLOBENZAPRINE 10 MG TAB: 10 | 30 days supply | Qty: 60 | Fill #3

## 2018-11-20 ENCOUNTER — Other Ambulatory Visit: Payer: Self-pay | Admitting: Family Medicine

## 2018-11-20 DIAGNOSIS — R0602 Shortness of breath: Secondary | ICD-10-CM

## 2018-11-20 MED FILL — OMEPRAZOLE DR 40 MG CAPSULE: 40 | 90 days supply | Qty: 180 | Fill #3

## 2018-11-20 MED FILL — VENTOLIN HFA 90 MCG INHALER: 108 (90 BAS | 50 days supply | Qty: 18 | Fill #0

## 2018-12-04 DIAGNOSIS — F314 Bipolar disorder, current episode depressed, severe, without psychotic features: Secondary | ICD-10-CM | POA: Diagnosis not present

## 2018-12-20 ENCOUNTER — Other Ambulatory Visit: Payer: Self-pay | Admitting: Family Medicine

## 2018-12-20 DIAGNOSIS — M5136 Other intervertebral disc degeneration, lumbar region: Secondary | ICD-10-CM

## 2019-01-02 ENCOUNTER — Other Ambulatory Visit: Payer: Self-pay

## 2019-01-02 DIAGNOSIS — M5136 Other intervertebral disc degeneration, lumbar region: Secondary | ICD-10-CM

## 2019-01-02 NOTE — Telephone Encounter (Signed)
Mrs Emmendorfer is asking for a refill on cyclobenzaprine (FLEXERIL) 10 MG tablet sent to [facility]

## 2019-01-03 MED ORDER — BACLOFEN 20 MG PO TABS: 20.0000 mg | ORAL_TABLET | Freq: Three times a day (TID) | ORAL | 0 refills | Status: DC

## 2019-01-03 MED FILL — BACLOFEN 20 MG TABLET: 20 | 10 days supply | Qty: 30 | Fill #0

## 2019-01-30 ENCOUNTER — Other Ambulatory Visit: Payer: Self-pay

## 2019-01-30 ENCOUNTER — Ambulatory Visit (INDEPENDENT_AMBULATORY_CARE_PROVIDER_SITE_OTHER): Payer: Medicare Other | Admitting: Surgery

## 2019-01-30 ENCOUNTER — Encounter: Payer: Self-pay | Admitting: Surgery

## 2019-01-30 ENCOUNTER — Ambulatory Visit (INDEPENDENT_AMBULATORY_CARE_PROVIDER_SITE_OTHER): Payer: Medicare Other

## 2019-01-30 DIAGNOSIS — M542 Cervicalgia: Secondary | ICD-10-CM

## 2019-01-30 DIAGNOSIS — M25512 Pain in left shoulder: Secondary | ICD-10-CM | POA: Diagnosis not present

## 2019-01-30 DIAGNOSIS — M4722 Other spondylosis with radiculopathy, cervical region: Secondary | ICD-10-CM | POA: Diagnosis not present

## 2019-01-30 MED ORDER — METHYLPREDNISOLONE ACETATE 80 MG/ML IJ SUSP: 80.0000 mg | Freq: Once | INTRAMUSCULAR | Status: DC

## 2019-01-30 MED ORDER — METHYLPREDNISOLONE 4 MG PO TABS: ORAL_TABLET | ORAL | 0 refills | Status: DC

## 2019-01-30 NOTE — Progress Notes (Signed)
Office Visit Note   Patient: Vanessa Wood           Date of Birth: 09/07/1970           MRN: [id number] Visit Date: 01/30/2019              Requested by: Hoy RegisterNewlin, En[place]ong, MD [address] [place],  [place] [id number] PCP: Hoy RegisterNewlin, Enobong, MD   Assessment & Plan: Visit Diagnoses:  1. Cervicalgia   2. Left shoulder pain, unspecified chronicity   3. Other spondylosis with radiculopathy, cervical region     Plan: In hopes of giving patient relief of her pain she was given a Depo-Medrol 80 mg IM injection today in the clinic along with a prescription for Medrol Dosepak 6-day taper to be taken as directed and she will start this tomorrow.  She can continue baclofen for muscle spasm that was previously prescribed by Dr. Otelia SergeantNitka.  I was planning on giving patient a Toradol IM injection and a short-term prescription for tramadol but both of these medications interact with the lithium that she currently takes.  Follow-up in a couple weeks for recheck.  May consider getting an MRI if not improving.  Follow-Up Instructions: Return in about 2 weeks (around 02/13/2019).   Orders:  Orders Placed This Encounter  Procedures  . XR Cervical Spine 2 or 3 views  . XR Shoulder Left   Meds ordered this encounter  Medications  . methylPREDNISolone acetate (DEPO-MEDROL) injection 80 mg  . methylPREDNISolone (MEDROL) 4 MG tablet    Sig: 6 day taper to be taken as directed.  Start January 31, 2019.    Dispense:  21 tablet    Refill:  0      Procedures: No procedures performed   Clinical Data: No additional findings.   Subjective: Chief Complaint  Patient presents with  . Neck - Pain  . Left Shoulder - Pain    HPI 48 year old white female comes in today with acute neck pain and left shoulder pain.  Patient states that pain started a week ago.  Denies injury or previous problems of this nature before onset.  States that left-sided neck pain radiates into the left trapezius muscle and  anterior left shoulder.  Pain can be described as constant at times but also aggravated with shoulder movement.  No radicular pain further down the arm.  Denies numbness and tingling.  She has a prescription for baclofen but this is not helping.  No right shoulder symptoms. Review of Systems No current cardiac pulmonary GI GU issues  Objective: Vital Signs: There were no vitals taken for this visit.  Physical Exam HENT:     Head: Normocephalic.  Eyes:     Extraocular Movements: Extraocular movements intact.     Pupils: Pupils are equal, round, and reactive to light.  Pulmonary:     Effort: No respiratory distress.  Musculoskeletal:     Comments: She does have good range of motion of the cervical spine but with discomfort on the left side.  Mild left brachial plexus tenderness.  Moderate to marked left trapezius tenderness/spasm.  Left shoulder good range of motion.  Negative impingement test.  Right shoulder unremarkable.  Neurovascular intact.  No focal motor deficits.  Skin:    General: Skin is warm and dry.  Neurological:     Mental Status: She is alert and oriented to person, place, and time.  Psychiatric:        Mood and Affect: Mood normal.  Ortho Exam  Specialty Comments:  No specialty comments available.  Imaging: No results found.   PMFS History: Patient Active Problem List   Diagnosis Date Noted  . Frequent urination 12/01/2016  . Major depressive disorder   . Hematemesis 03/30/2012  . Night sweats 03/30/2012  . Nausea & vomiting 03/30/2012  . Rhinorrhea 11/15/2011  . Id reaction 04/26/2011  . Fungal infection 04/11/2011  . POTENTIAL CARIES 04/20/2010  . UNSPECIFIED VAGINITIS AND VULVOVAGINITIS 04/20/2010  . ELBOW PAIN, RIGHT 12/11/2009  . OLECRANON BURSITIS, RIGHT 12/11/2009  . SORE THROAT 11/13/2009  . BACK PAIN, CHRONIC 10/30/2009  . ANKLE SPRAIN, LEFT 08/13/2009  . [place] DISEASE, LUMBOSACRAL SPINE 05/18/2009  . SPINAL STENOSIS OF LUMBAR REGION  04/03/2009  . OBESITY, UNSPECIFIED 03/24/2009  . GERD 02/13/2009  . SHOULDER PAIN, LEFT 02/13/2009  . DYSPNEA ON EXERTION 02/13/2009  . SCIATICA 11/18/2008  . BIPOLAR DISORDER UNSPECIFIED 09/02/2008  . ANXIETY STATE, UNSPECIFIED 09/02/2008  . 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 TOBACCO ABUSE 09/02/2008  . SPRAIN AND STRAIN OF LUMBOSACRAL 09/02/2008   Past Medical History:  Diagnosis Date  . Allergy   . Anxiety   . Arthritis   . Asthma   . Bipolar 1 disorder ([place])   . Borderline personality disorder ([place])   . Cataract   . Cervical dysphagia    age 28  . Diverticulitis   . GERD (gastroesophageal reflux disease)   . H/O degenerative disc disease   . Major depressive disorder   . Peptic ulcer disease   . PTSD (post-traumatic stress disorder)   . Sciatic leg pain   . Substance abuse ([place])     Family History  Problem Relation Age of Onset  . Emphysema Maternal Grandmother   . Lung cancer Maternal Grandmother        smoker  . Pancreatic cancer Maternal Grandmother   . Pancreatic cancer Maternal Grandfather   . Throat cancer Maternal Grandfather        smoker  . Colon polyps Maternal Grandfather   . Other Mother        borderline diabetic  . GER disease Mother   . Fibromyalgia Mother   . Osteoporosis Mother   . COPD Mother   . Depression Mother   . Colon polyps Mother        benign  . Diverticulosis Mother   . Suicidality Father   . Other Daughter        accidently overdose  . Colon cancer Neg Hx   . Rectal cancer Neg Hx   . Esophageal cancer Neg Hx   . Stomach cancer Neg Hx     Past Surgical History:  Procedure Laterality Date  . cervical dysphagia cell removed     age 25  . RETINAL DETACHMENT SURGERY  1985  . UPPER GASTROINTESTINAL ENDOSCOPY     Social History   Occupational History  . Occupation: disabled  Tobacco Use  . Smoking status: Current Every Day Smoker    Packs/day: 1.00    Years: 31.00    Pack years: 31.00    Types: Cigarettes   . Smokeless tobacco: Never Used  . Tobacco comment: decrease smoking to 0.5 ppd  Substance and Sexual Activity  . Alcohol use: Not Currently    Comment: quit years ago  . Drug use: Yes    Types: Marijuana, Cocaine    Comment: no cocaine or marijuana in last years  . Sexual activity: Yes    Birth control/protection: None

## 2019-02-04 IMAGING — US US ABDOMEN COMPLETE
1 series · 14 of 25 positions shown · non-contrast
Comparison: CT abdomen and pelvis June 27, 2013

CLINICAL DATA: Nausea and vomiting, chronic

EXAM:
ABDOMEN ULTRASOUND COMPLETE

[Series 1: us abdomen complete · 0.25mm/px · 14 of 75 slices shown]
[im 1/75]
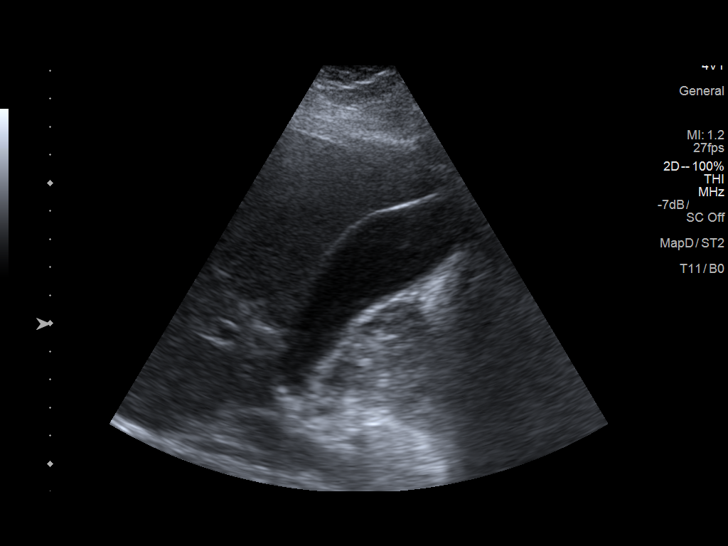
[im 7/75]
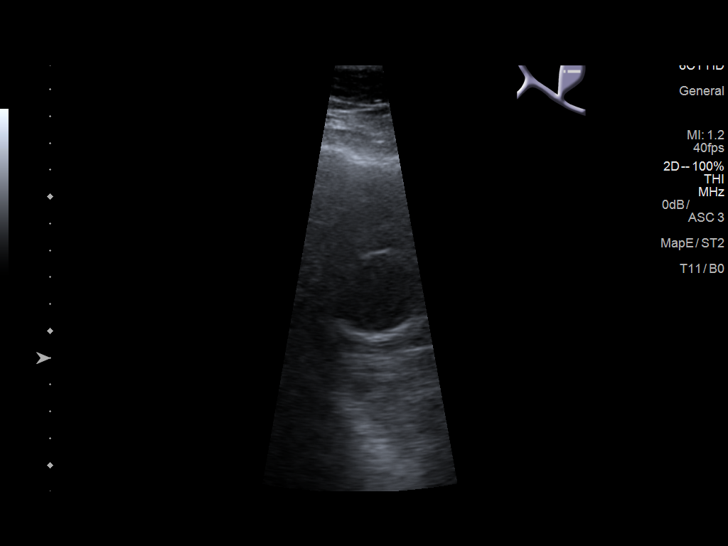
[im 13/75]
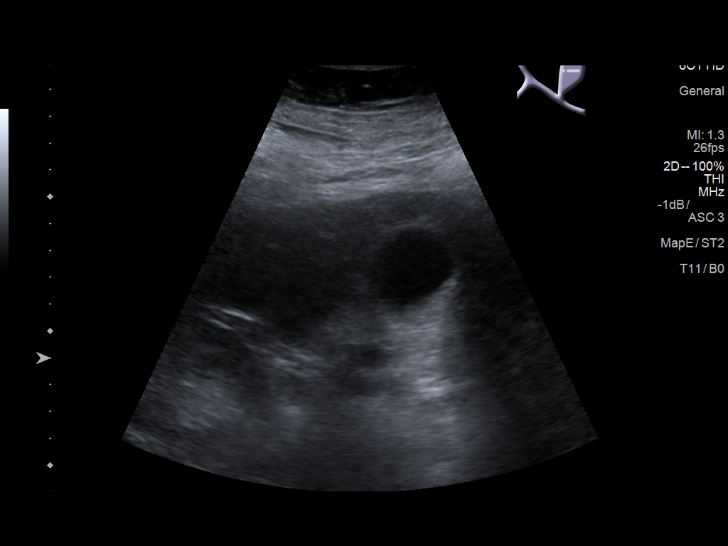
[im 19/75]
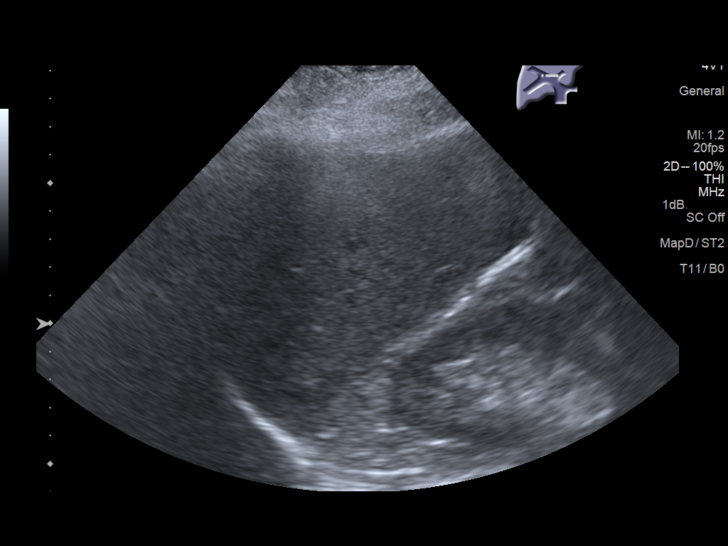
[im 25/75]
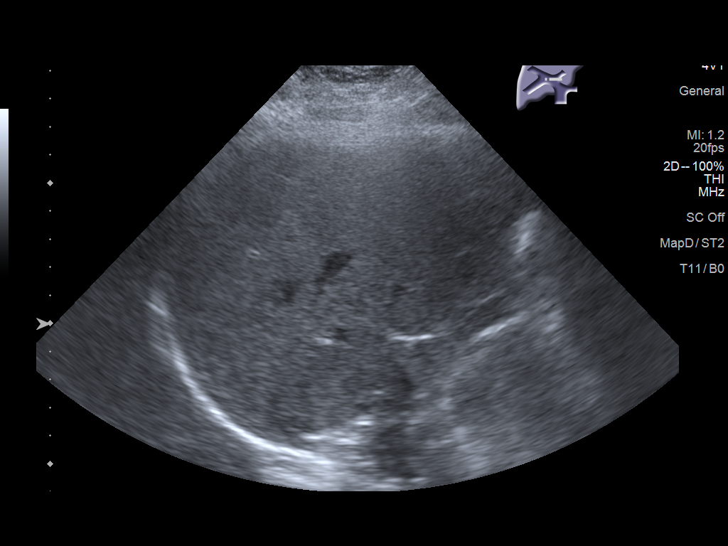
[im 28/75]
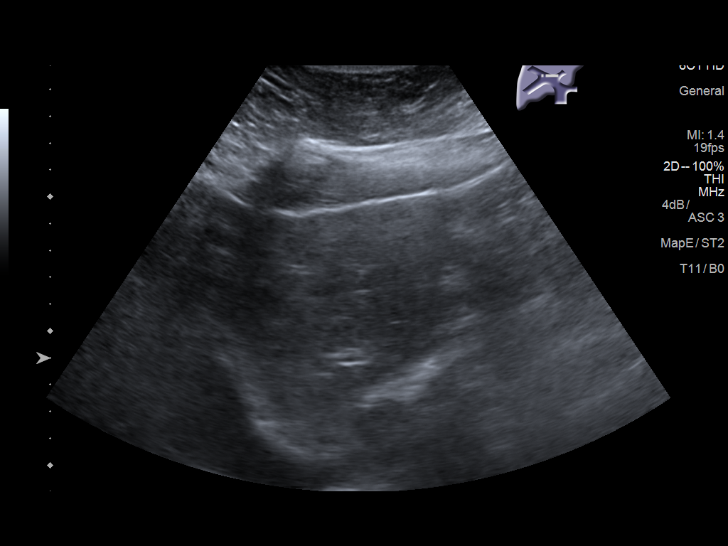
[im 34/75]
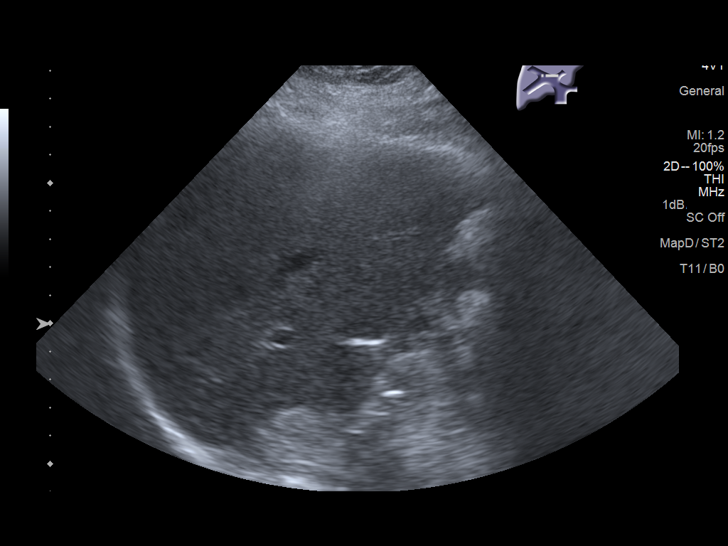
[im 41/75]
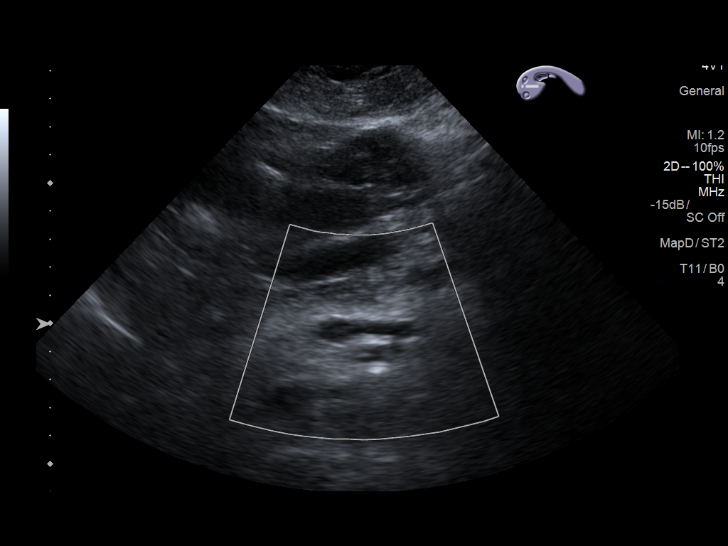
[im 47/75]
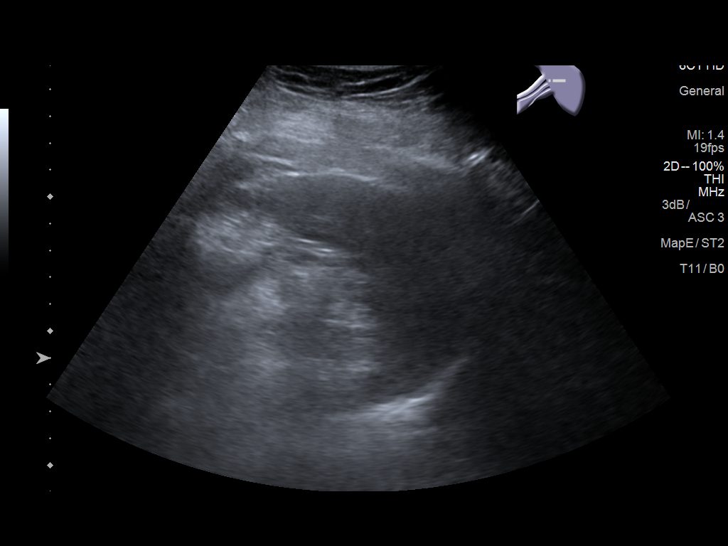
[im 50/75]
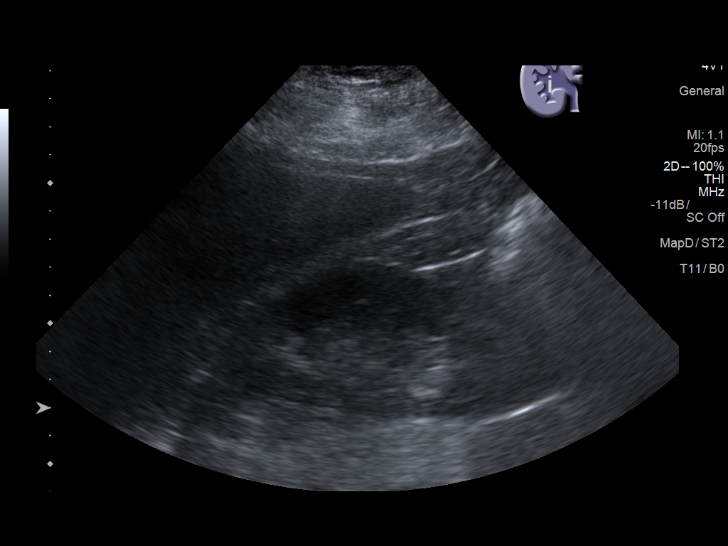
[im 56/75]
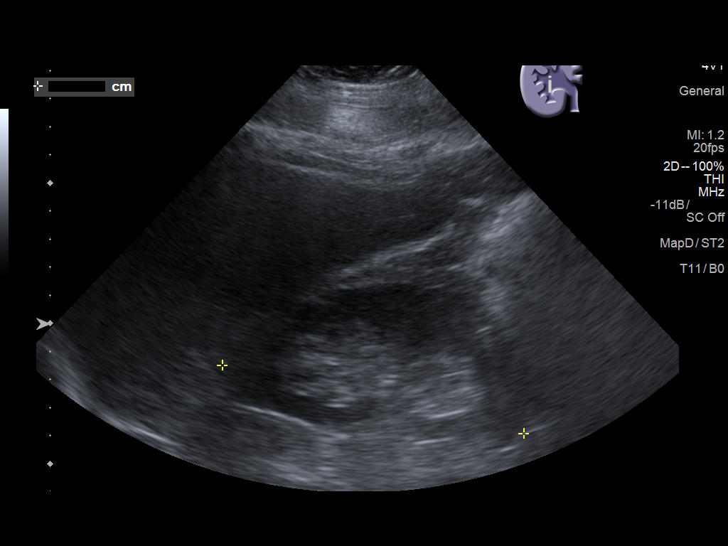
[im 62/75]
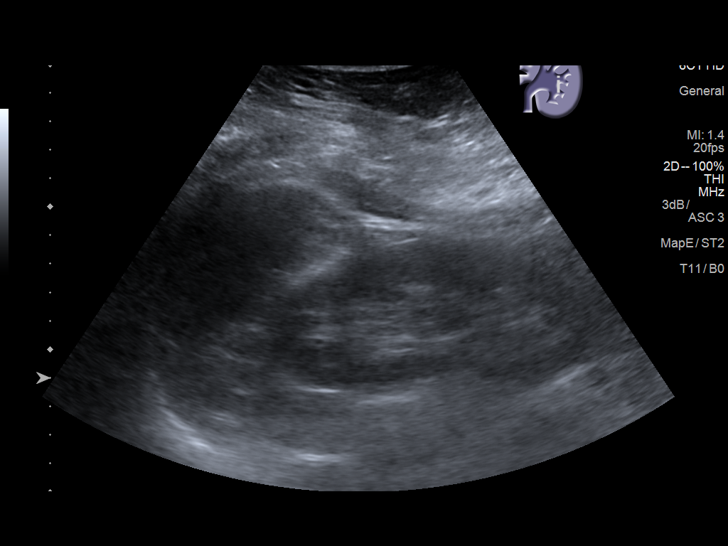
[im 68/75]
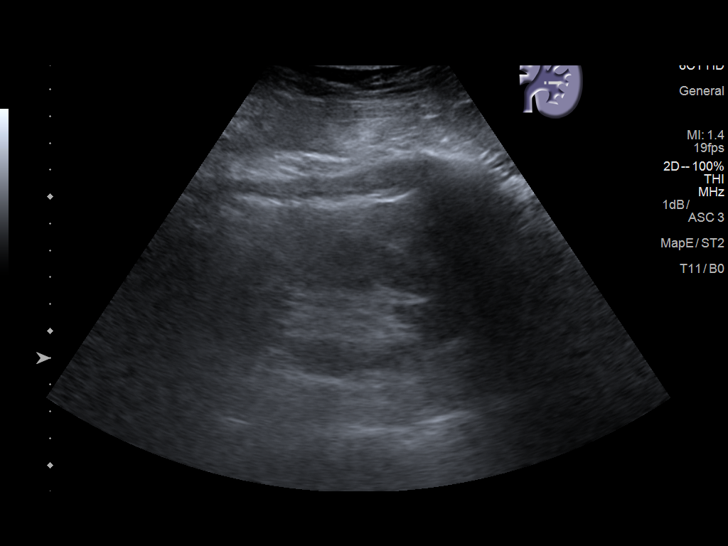
[im 75/75]
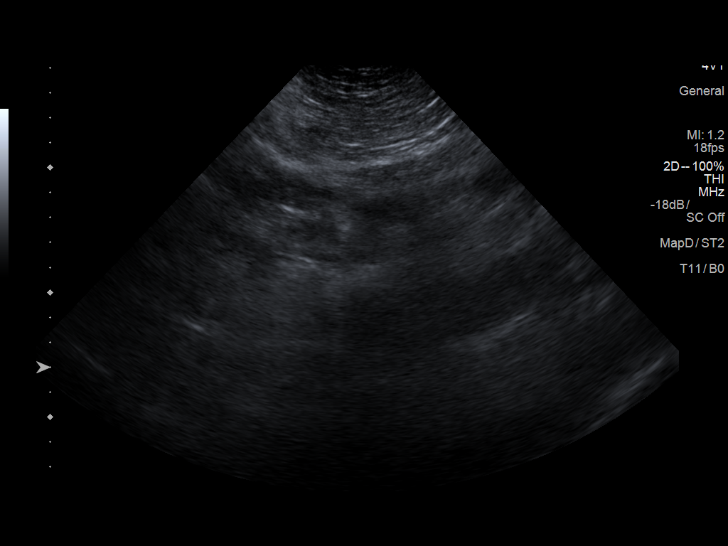

[14 of 25 positions shown; findings below may reference images not displayed]

FINDINGS: Gallbladder: No gallstones or wall thickening visualized. There is
no pericholecystic fluid. No sonographic Murphy sign noted by
sonographer.

Common bile duct: Diameter: 5 mm. No intrahepatic, common hepatic,
or common bile duct dilatation.

Liver: No focal lesion identified. Within normal limits in
parenchymal echogenicity. Portal vein is patent on color Doppler
imaging with normal direction of blood flow towards the liver.

IVC: No abnormality visualized.

Pancreas: No pancreatic mass or inflammatory focus..

Spleen: Size and appearance within normal limits.

Right Kidney: Length: 11.0 cm. Echogenicity within normal limits. No
mass or hydronephrosis visualized.

Left Kidney: Length: 11.2 cm. Echogenicity within normal limits. No
mass or hydronephrosis visualized.

Abdominal aorta: No aneurysm visualized.

Other findings: No demonstrable ascites.
IMPRESSION: Study within normal limits.

## 2019-02-05 ENCOUNTER — Telehealth: Payer: Self-pay | Admitting: Specialist

## 2019-02-05 DIAGNOSIS — F314 Bipolar disorder, current episode depressed, severe, without psychotic features: Secondary | ICD-10-CM | POA: Diagnosis not present

## 2019-02-05 NOTE — Telephone Encounter (Signed)
Patient called stating she has been taking her prescription of MethylPrednisolone but states it is not helping.  Patient states her left hand thumb and first finger also have "tingling sensation."  Patient is requesting a prescription for pain medicine.

## 2019-02-05 NOTE — Telephone Encounter (Signed)
Patient called stating she has been taking her prescription of MethylPrednisolone but states it is not helping.  Patient states her left hand thumb and first finger also have "tingling sensation."  Patient is requesting a prescription for pain medicine. ---Please advise

## 2019-02-06 ENCOUNTER — Telehealth: Payer: Self-pay | Admitting: Specialist

## 2019-02-06 NOTE — Telephone Encounter (Signed)
Can you please advise on this? You saw her on 01/30/2019

## 2019-02-06 NOTE — Telephone Encounter (Signed)
Patient called advised her left shoulder is still hurting pretty bad and asked if she can get something for pain or another muscle relaxer? See previous message. The number to contact patient is [phone]

## 2019-02-12 NOTE — Telephone Encounter (Signed)
She needs rov or cervical MRI per my last office note.  Which does she prefer?

## 2019-02-14 NOTE — Telephone Encounter (Signed)
Patient wants to go ahead with MRI of her neck.

## 2019-03-04 ENCOUNTER — Other Ambulatory Visit: Payer: Self-pay | Admitting: Radiology

## 2019-03-04 DIAGNOSIS — M542 Cervicalgia: Secondary | ICD-10-CM

## 2019-03-04 DIAGNOSIS — M4722 Other spondylosis with radiculopathy, cervical region: Secondary | ICD-10-CM

## 2019-03-04 NOTE — Telephone Encounter (Signed)
Okay to schedule cervical spine MRI rule out HNP/stenosis.  Needs ROV with Dr. Louanne Skye after completion to discuss results and further treatment options

## 2019-03-04 NOTE — Telephone Encounter (Signed)
Order entered for MR Cervical spine

## 2019-03-05 DIAGNOSIS — F314 Bipolar disorder, current episode depressed, severe, without psychotic features: Secondary | ICD-10-CM | POA: Diagnosis not present

## 2019-04-02 DIAGNOSIS — F314 Bipolar disorder, current episode depressed, severe, without psychotic features: Secondary | ICD-10-CM | POA: Diagnosis not present

## 2019-05-07 DIAGNOSIS — F314 Bipolar disorder, current episode depressed, severe, without psychotic features: Secondary | ICD-10-CM | POA: Diagnosis not present

## 2019-05-17 ENCOUNTER — Ambulatory Visit: Payer: Self-pay | Admitting: Specialist

## 2019-06-25 DIAGNOSIS — F314 Bipolar disorder, current episode depressed, severe, without psychotic features: Secondary | ICD-10-CM | POA: Diagnosis not present

## 2019-08-08 DIAGNOSIS — F419 Anxiety disorder, unspecified: Secondary | ICD-10-CM | POA: Diagnosis not present

## 2019-08-08 DIAGNOSIS — F314 Bipolar disorder, current episode depressed, severe, without psychotic features: Secondary | ICD-10-CM | POA: Diagnosis not present

## 2019-08-08 DIAGNOSIS — F4310 Post-traumatic stress disorder, unspecified: Secondary | ICD-10-CM | POA: Diagnosis not present

## 2019-08-08 DIAGNOSIS — F603 Borderline personality disorder: Secondary | ICD-10-CM | POA: Diagnosis not present

## 2019-08-12 DIAGNOSIS — F419 Anxiety disorder, unspecified: Secondary | ICD-10-CM | POA: Diagnosis not present

## 2019-08-12 DIAGNOSIS — F603 Borderline personality disorder: Secondary | ICD-10-CM | POA: Diagnosis not present

## 2019-08-12 DIAGNOSIS — F4310 Post-traumatic stress disorder, unspecified: Secondary | ICD-10-CM | POA: Diagnosis not present

## 2019-08-12 DIAGNOSIS — F314 Bipolar disorder, current episode depressed, severe, without psychotic features: Secondary | ICD-10-CM | POA: Diagnosis not present

## 2019-09-17 DIAGNOSIS — F314 Bipolar disorder, current episode depressed, severe, without psychotic features: Secondary | ICD-10-CM | POA: Diagnosis not present

## 2019-10-15 DIAGNOSIS — F314 Bipolar disorder, current episode depressed, severe, without psychotic features: Secondary | ICD-10-CM | POA: Diagnosis not present

## 2019-11-04 DIAGNOSIS — F314 Bipolar disorder, current episode depressed, severe, without psychotic features: Secondary | ICD-10-CM | POA: Diagnosis not present

## 2019-11-04 DIAGNOSIS — F419 Anxiety disorder, unspecified: Secondary | ICD-10-CM | POA: Diagnosis not present

## 2019-11-11 DIAGNOSIS — F331 Major depressive disorder, recurrent, moderate: Secondary | ICD-10-CM | POA: Diagnosis not present

## 2019-11-11 DIAGNOSIS — Z23 Encounter for immunization: Secondary | ICD-10-CM | POA: Diagnosis not present

## 2019-11-11 DIAGNOSIS — F314 Bipolar disorder, current episode depressed, severe, without psychotic features: Secondary | ICD-10-CM | POA: Diagnosis not present

## 2019-12-09 DIAGNOSIS — F314 Bipolar disorder, current episode depressed, severe, without psychotic features: Secondary | ICD-10-CM | POA: Diagnosis not present

## 2019-12-09 DIAGNOSIS — Z23 Encounter for immunization: Secondary | ICD-10-CM | POA: Diagnosis not present

## 2019-12-09 DIAGNOSIS — F331 Major depressive disorder, recurrent, moderate: Secondary | ICD-10-CM | POA: Diagnosis not present

## 2020-03-17 DIAGNOSIS — F4310 Post-traumatic stress disorder, unspecified: Secondary | ICD-10-CM | POA: Diagnosis not present

## 2020-03-17 DIAGNOSIS — F419 Anxiety disorder, unspecified: Secondary | ICD-10-CM | POA: Diagnosis not present

## 2020-03-17 DIAGNOSIS — F314 Bipolar disorder, current episode depressed, severe, without psychotic features: Secondary | ICD-10-CM | POA: Diagnosis not present

## 2020-06-09 DIAGNOSIS — F314 Bipolar disorder, current episode depressed, severe, without psychotic features: Secondary | ICD-10-CM | POA: Diagnosis not present

## 2020-07-07 DIAGNOSIS — Z23 Encounter for immunization: Secondary | ICD-10-CM | POA: Diagnosis not present

## 2020-07-07 DIAGNOSIS — F314 Bipolar disorder, current episode depressed, severe, without psychotic features: Secondary | ICD-10-CM | POA: Diagnosis not present

## 2021-11-09 ENCOUNTER — Telehealth: Payer: Self-pay | Admitting: Family Medicine

## 2021-11-09 NOTE — Telephone Encounter (Signed)
Copied from [place] [phone]. Topic: Referral - Question ?>> Nov 08, 2021  4:49 PM Pawlus, Brayton Layman A wrote: ?Reason for CRM: Pt wanted to know if a referral could be sent in to a dentist that accepts medicaid, pt wanted to know if this could be done without her scheduling an office visit first.  please advise. ?

## 2021-11-10 NOTE — Telephone Encounter (Signed)
Routing message to Alinda Sierras for review.  ?

## 2021-11-10 NOTE — Telephone Encounter (Signed)
Patient called back [facility] was not available [facility] please call patient at Ph# [phone] ?

## 2021-11-19 NOTE — Telephone Encounter (Signed)
Forwarding request to Arna Medici.  ?

## 2021-11-19 NOTE — Telephone Encounter (Addendum)
Pt is calling back to follow up. Pt stated she has not received the information. ? ?Arna Medici advised me she would be resending information for pt.  ? ?Pt requesting a call back.  ?

## 2022-03-30 ENCOUNTER — Other Ambulatory Visit: Payer: Self-pay

## 2022-03-30 ENCOUNTER — Encounter: Payer: Self-pay | Admitting: Family Medicine

## 2022-03-30 ENCOUNTER — Ambulatory Visit: Payer: Medicare Other | Attending: Family Medicine | Admitting: Family Medicine

## 2022-03-30 VITALS — BP 133/82 | HR 77 | Temp 97.9°F | Wt 249.4 lb

## 2022-03-30 DIAGNOSIS — J4520 Mild intermittent asthma, uncomplicated: Secondary | ICD-10-CM | POA: Insufficient documentation

## 2022-03-30 DIAGNOSIS — J45909 Unspecified asthma, uncomplicated: Secondary | ICD-10-CM | POA: Diagnosis not present

## 2022-03-30 DIAGNOSIS — Z23 Encounter for immunization: Secondary | ICD-10-CM

## 2022-03-30 DIAGNOSIS — Z1211 Encounter for screening for malignant neoplasm of colon: Secondary | ICD-10-CM | POA: Diagnosis not present

## 2022-03-30 DIAGNOSIS — R079 Chest pain, unspecified: Secondary | ICD-10-CM | POA: Insufficient documentation

## 2022-03-30 DIAGNOSIS — K219 Gastro-esophageal reflux disease without esophagitis: Secondary | ICD-10-CM | POA: Diagnosis not present

## 2022-03-30 DIAGNOSIS — Z1159 Encounter for screening for other viral diseases: Secondary | ICD-10-CM

## 2022-03-30 DIAGNOSIS — R0789 Other chest pain: Secondary | ICD-10-CM | POA: Diagnosis not present

## 2022-03-30 DIAGNOSIS — Z79899 Other long term (current) drug therapy: Secondary | ICD-10-CM

## 2022-03-30 DIAGNOSIS — F319 Bipolar disorder, unspecified: Secondary | ICD-10-CM | POA: Diagnosis not present

## 2022-03-30 DIAGNOSIS — G8929 Other chronic pain: Secondary | ICD-10-CM | POA: Diagnosis not present

## 2022-03-30 DIAGNOSIS — Z13228 Encounter for screening for other metabolic disorders: Secondary | ICD-10-CM

## 2022-03-30 DIAGNOSIS — K2100 Gastro-esophageal reflux disease with esophagitis, without bleeding: Secondary | ICD-10-CM | POA: Insufficient documentation

## 2022-03-30 DIAGNOSIS — F3177 Bipolar disorder, in partial remission, most recent episode mixed: Secondary | ICD-10-CM

## 2022-03-30 MED ORDER — OMEPRAZOLE 20 MG PO CPDR: DELAYED_RELEASE_CAPSULE | ORAL | 3 refills | Status: DC

## 2022-03-30 MED ORDER — ALBUTEROL SULFATE HFA 108 (90 BASE) MCG/ACT IN AERS: 1.0000 | INHALATION_SPRAY | Freq: Four times a day (QID) | RESPIRATORY_TRACT | 3 refills | Status: DC | PRN

## 2022-03-30 NOTE — Patient Instructions (Signed)
Chest Wall Pain Chest wall pain is pain in or around the bones and muscles of your chest. Sometimes, an injury causes this pain. Excessive coughing or overuse of arm and chest muscles may also cause chest wall pain. Sometimes, the cause may not be known. This pain may take several weeks or longer to get better. Follow these instructions at home: Managing pain, stiffness, and swelling  If directed, put ice on the painful area: Put ice in a plastic bag. Place a towel between your skin and the bag. Leave the ice on for 20 minutes, 2-3 times per day. Activity Rest as told by your health care provider. Avoid activities that cause pain. These include any activities that use your chest muscles or your abdominal and side muscles to lift heavy items. Ask your health care provider what activities are safe for you. General instructions  Take over-the-counter and prescription medicines only as told by your health care provider. Do not use any products that contain nicotine or tobacco, such as cigarettes, e-cigarettes, and chewing tobacco. These can delay healing after injury. If you need help quitting, ask your health care provider. Keep all follow-up visits as told by your health care provider. This is important. Contact a health care provider if: You have a fever. Your chest pain becomes worse. You have new symptoms. Get help right away if: You have nausea or vomiting. You feel sweaty or light-headed. You have a cough with mucus from your lungs (sputum) or you cough up blood. You develop shortness of breath. These symptoms may represent a serious problem that is an emergency. Do not wait to see if the symptoms will go away. Get medical help right away. Call your local emergency services (911 in the [place] Do not drive yourself to the hospital. Summary Chest wall pain is pain in or around the bones and muscles of your chest. Depending on the cause, it may be treated with ice, rest, medicines, and  avoiding activities that cause pain. Contact a health care provider if you have a fever, worsening chest pain, or new symptoms. Get help right away if you feel light-headed or you develop shortness of breath. These symptoms may be an emergency. This information is not intended to replace advice given to you by your health care provider. Make sure you discuss any questions you have with your health care provider. Document Revised: 10/02/2020 Document Reviewed: 10/02/2020 Elsevier Patient Education  2023 [employer]  

## 2022-03-30 NOTE — Progress Notes (Signed)
Subjective:  Patient ID: Vanessa Wood, female    DOB: 1970/12/15  Age: 51 y.o. MRN: [id number]  CC: Establish Care   HPI Vanessa Wood is a 51 y.o. year old female with a history of GERD, Asthma, chronic back pain, bipolar disorder who presents today for a follow-up visit Last seen in the Clinic in 2019.  Interval History:  Bipolar disorder is managed by [facility] and she endorses adherence with her psychotropic medications.  Her chest hurts in the central aspect of her sternum from time to time. States she is unsure if it is her reflux or not. She endorses some dyspnea and has gained weight since her last visit in 2019. Parasternal chest pain occurs at rest with no radiation of symptoms   Requests a refill of her inhaler for her Asthma which is stable with no flares Past Medical History:  Diagnosis Date   Allergy    Anxiety    Arthritis    Asthma    Bipolar 1 disorder (HCC)    Borderline personality disorder ([place])    Cataract    Cervical dysphagia    age 51   Diverticulitis    GERD (gastroesophageal reflux disease)    H/O degenerative disc disease    Major depressive disorder    Peptic ulcer disease    PTSD (post-traumatic stress disorder)    Sciatic leg pain    Substance abuse ([place])     Past Surgical History:  Procedure Laterality Date   cervical dysphagia cell removed     age 51   RETINAL DETACHMENT SURGERY  1985   UPPER GASTROINTESTINAL ENDOSCOPY      Family History  Problem Relation Age of Onset   Emphysema Maternal Grandmother    Lung cancer Maternal Grandmother        smoker   Pancreatic cancer Maternal Grandmother    Pancreatic cancer Maternal Grandfather    Throat cancer Maternal Grandfather        smoker   Colon polyps Maternal Grandfather    Other Mother        borderline diabetic   GER disease Mother    Fibromyalgia Mother    Osteoporosis Mother    COPD Mother    Depression Mother    Colon polyps Mother        benign   Diverticulosis  Mother    Suicidality Father    Other Daughter        accidently overdose   Colon cancer Neg Hx    Rectal cancer Neg Hx    Esophageal cancer Neg Hx    Stomach cancer Neg Hx     Social History   Socioeconomic History   Marital status: Single    Spouse name: Not on file   Number of children: 2   Years of education: Not on file   Highest education level: Not on file  Occupational History   Occupation: disabled  Tobacco Use   Smoking status: Every Day    Packs/day: 1.00    Years: 31.00    Total pack years: 31.00    Types: Cigarettes   Smokeless tobacco: Never   Tobacco comments:    decrease smoking to 0.5 ppd  Vaping Use   Vaping Use: Never used  Substance and Sexual Activity   Alcohol use: Not Currently    Comment: quit years ago   Drug use: Yes    Types: Marijuana, Cocaine    Comment: no cocaine or marijuana in last years  Sexual activity: Yes    Birth control/protection: None  Other Topics Concern   Not on file  Social History Narrative   Not on file   Social Determinants of Health   Financial Resource Strain: Not on file  Food Insecurity: Not on file  Transportation Needs: Not on file  Physical Activity: Not on file  Stress: Not on file  Social Connections: Not on file    Allergies  Allergen Reactions   Codeine Hives    Outpatient Medications Prior to Visit  Medication Sig Dispense Refill   ARIPiprazole (ABILIFY) 9.75 MG/1.3ML injection Inject into the muscle every 30 (thirty) days.     gabapentin (NEURONTIN) 300 MG capsule Take 1 capsule (300 mg total) by mouth 2 (two) times daily. 60 capsule 3   albuterol (VENTOLIN HFA) 108 (90 Base) MCG/ACT inhaler Inhale 1 puff into the lungs every 6 (six) hours as needed for wheezing or shortness of breath. MUST MAKE APPT FOR FURTHER REFILLS 18 g 0   omeprazole (PRILOSEC) 20 MG capsule Take 1 tablet by mouth twice daily. 90 capsule 3   LITHIUM PO Take 1 tablet by mouth 2 (two) times daily. (Patient not taking:  Reported on 03/30/2022)     methylPREDNISolone (MEDROL) 4 MG tablet 6 day taper to be taken as directed.  Start January 31, 2019. (Patient not taking: Reported on 03/30/2022) 21 tablet 0   traZODone (DESYREL) 50 MG tablet Take 50 mg by mouth at bedtime. (Patient not taking: Reported on 03/30/2022)     Venlafaxine HCl (EFFEXOR PO) Take 1 tablet by mouth daily. (Patient not taking: Reported on 03/30/2022)     baclofen (LIORESAL) 20 MG tablet Take 1 tablet (20 mg total) by mouth 3 (three) times daily. (Patient not taking: Reported on 03/30/2022) 30 each 0   diclofenac (VOLTAREN) 50 MG EC tablet Take 1 tablet (50 mg total) by mouth 2 (two) times daily. (Patient not taking: Reported on 03/30/2022) 90 tablet 3   ondansetron (ZOFRAN) 4 MG tablet Take 1 tablet at bedtime daily. (Patient not taking: Reported on 03/30/2022) 30 tablet 2   promethazine (PHENERGAN) 25 MG tablet Take 1 tablet (25 mg total) by mouth every 8 (eight) hours as needed for nausea or vomiting. (Patient not taking: Reported on 05/09/2018) 60 tablet 1   traMADol (ULTRAM) 50 MG tablet Take 1 tablet (50 mg total) by mouth every 8 (eight) hours as needed. (Patient not taking: Reported on 03/30/2022) 60 tablet 0   Facility-Administered Medications Prior to Visit  Medication Dose Route Frequency Provider Last Rate Last Admin   0.9 %  sodium chloride infusion  500 mL Intravenous Once Irene Shipper, MD       methylPREDNISolone acetate (DEPO-MEDROL) injection 80 mg  80 mg Intramuscular Once Benjiman Core M, PA-C         ROS Review of Systems  Constitutional:  Negative for activity change and appetite change.  HENT:  Negative for sinus pressure and sore throat.   Respiratory:  Positive for chest tightness and shortness of breath. Negative for wheezing.   Cardiovascular:  Negative for chest pain and palpitations.  Gastrointestinal:  Negative for abdominal distention, abdominal pain and constipation.  Genitourinary: Negative.   Musculoskeletal: Negative.    Psychiatric/Behavioral:  Negative for behavioral problems and dysphoric mood.    Objective:  BP 133/82   Pulse 77   Temp 97.9 F (36.6 C) (Oral)   Wt 249 lb 6.4 oz (113.1 kg)   SpO2 96%   BMI  40.25 kg/m      03/30/2022   11:04 AM 05/09/2018   10:58 AM 05/09/2018   10:48 AM  BP/Weight  Systolic BP 407 680 881  Diastolic BP 82 66 66  Wt. (Lbs) 249.4    BMI 40.25 kg/m2        Physical Exam Constitutional:      Appearance: She is well-developed. She is obese.  Cardiovascular:     Rate and Rhythm: Normal rate.     Heart sounds: Normal heart sounds. No murmur heard. Pulmonary:     Effort: Pulmonary effort is normal.     Breath sounds: Normal breath sounds. No wheezing or rales.  Chest:     Chest wall: No tenderness.  Abdominal:     General: Bowel sounds are normal. There is no distension.     Palpations: Abdomen is soft. There is no mass.     Tenderness: There is no abdominal tenderness.  Musculoskeletal:        General: Normal range of motion.     Right lower leg: No edema.     Left lower leg: No edema.  Neurological:     Mental Status: She is alert and oriented to person, place, and time.  Psychiatric:        Mood and Affect: Mood normal.        Latest Ref Rng & Units 11/23/2017   12:04 PM 11/28/2016    9:15 AM 07/06/2014    3:05 PM  CMP  Glucose 65 - 99 mg/dL 107  104  92   BUN 6 - 24 mg/dL 9  13  7    Creatinine 0.57 - 1.00 mg/dL 0.75  0.83  0.67   Sodium 134 - 144 mmol/L 140  139  138   Potassium 3.5 - 5.2 mmol/L 4.7  4.7  4.1   Chloride 96 - 106 mmol/L 105  99  103   CO2 20 - 29 mmol/L 22  23  24    Calcium 8.7 - 10.2 mg/dL 9.3  9.3  9.1   Total Protein 6.0 - 8.5 g/dL 6.9  7.3  6.6   Total Bilirubin 0.0 - 1.2 mg/dL 0.3  <0.2  0.2   Alkaline Phos 39 - 117 IU/L 85  84  59   AST 0 - 40 IU/L 27  15  15    ALT 0 - 32 IU/L 33  13  8     Lipid Panel     Component Value Date/Time   LDLDIRECT 110 (H) 04/20/2010 1735    CBC    Component Value Date/Time    WBC 8.9 07/06/2014 1505   RBC 4.27 07/06/2014 1505   HGB 13.4 07/06/2014 1505   HCT 39.6 07/06/2014 1505   PLT 223 07/06/2014 1505   MCV 92.7 07/06/2014 1505   MCH 31.4 07/06/2014 1505   MCHC 33.8 07/06/2014 1505   RDW 12.5 07/06/2014 1505   LYMPHSABS 2.7 07/06/2014 1505   MONOABS 0.5 07/06/2014 1505   EOSABS 0.2 07/06/2014 1505   BASOSABS 0.0 07/06/2014 1505    No results found for: "HGBA1C"  Assessment & Plan:  1. Mild intermittent asthma without complication Stable - albuterol (VENTOLIN HFA) 108 (90 Base) MCG/ACT inhaler; Inhale 1 puff into the lungs every 6 (six) hours as needed for wheezing or shortness of breath.  Dispense: 18 g; Refill: 3  2. Screening for metabolic disorder - JSR15+XYVO - LP+Non-HDL Cholesterol  3. Gastroesophageal reflux disease with esophagitis without hemorrhage Could be the source  of her chest pain as she has run out of her PPI Advised to avoid recumbency up to 2 hours postmeal, avoid late meals, avoid foods that trigger symptoms. - omeprazole (PRILOSEC) 20 MG capsule; Take 1 tablet by mouth twice daily.  Dispense: 90 capsule; Refill: 3  4. Atypical chest pain EKG with no ischemic changes Likely GI source of chest pain  5. Screening for colon cancer - Ambulatory referral to Gastroenterology  6. Screening for viral disease - HCV Ab w Reflex to Quant PCR  7. High risk medication use - Hemoglobin A1c  8.  Bipolar disorder Stable Continue Abilify, lithium, trazodone, gabapentin as per mental health  Health Care Maintenance: We will address at next visit Meds ordered this encounter  Medications   omeprazole (PRILOSEC) 20 MG capsule    Sig: Take 1 tablet by mouth twice daily.    Dispense:  90 capsule    Refill:  3   albuterol (VENTOLIN HFA) 108 (90 Base) MCG/ACT inhaler    Sig: Inhale 1 puff into the lungs every 6 (six) hours as needed for wheezing or shortness of breath.    Dispense:  18 g    Refill:  3    Follow-up: Return in  about 1 month (around 04/30/2022) for Medicare wellness exam, Pap smear.       Charlott Rakes, MD, FAAFP. [facility] [place], [place]   03/30/2022, 12:58 PM

## 2022-03-30 NOTE — Progress Notes (Signed)
Needs medication refills.

## 2022-03-31 LAB — LP+NON-HDL CHOLESTEROL
Cholesterol, Total: 187 mg/dL (ref 100–199)
HDL: 55 mg/dL (ref >39)
LDL Chol Calc (NIH): 110 mg/dL — ABNORMAL HIGH (ref 0–99)
Total Non-HDL-Chol (LDL+VLDL): 132 mg/dL — ABNORMAL HIGH (ref 0–129)
Triglycerides: 122 mg/dL (ref 0–149)
VLDL Cholesterol Cal: 22 mg/dL (ref 5–40)

## 2022-03-31 LAB — CMP14+EGFR
ALT: 13 IU/L (ref 0–32)
AST: 19 IU/L (ref 0–40)
Albumin/Globulin Ratio: 1.4 (ref 1.2–2.2)
Albumin: 4.4 g/dL (ref 3.9–4.9)
Alkaline Phosphatase: 94 IU/L (ref 44–121)
BUN/Creatinine Ratio: 11 (ref 9–23)
BUN: 9 mg/dL (ref 6–24)
Bilirubin Total: 0.3 mg/dL (ref 0.0–1.2)
CO2: 24 mmol/L (ref 20–29)
Calcium: 9.8 mg/dL (ref 8.7–10.2)
Chloride: 102 mmol/L (ref 96–106)
Creatinine, Ser: 0.81 mg/dL (ref 0.57–1.00)
Globulin, Total: 3.1 g/dL (ref 1.5–4.5)
Glucose: 98 mg/dL (ref 70–99)
Potassium: 5.1 mmol/L (ref 3.5–5.2)
Sodium: 141 mmol/L (ref 134–144)
Total Protein: 7.5 g/dL (ref 6.0–8.5)
eGFR: 89 mL/min/{1.73_m2} (ref >59)

## 2022-03-31 LAB — HCV AB W REFLEX TO QUANT PCR: HCV Ab: NONREACTIVE

## 2022-03-31 LAB — HCV INTERPRETATION

## 2022-03-31 LAB — HEMOGLOBIN A1C
Est. average glucose Bld gHb Est-mCnc: 117 mg/dL
Hgb A1c MFr Bld: 5.7 % — ABNORMAL HIGH (ref 4.8–5.6)

## 2022-05-04 ENCOUNTER — Other Ambulatory Visit (HOSPITAL_COMMUNITY)
Admission: RE | Admit: 2022-05-04 | Discharge: 2022-05-04 | Disposition: A | Discharge status: Home / Self Care | Payer: Medicare Other | Source: Ambulatory Visit | Attending: Family Medicine | Admitting: Family Medicine

## 2022-05-04 ENCOUNTER — Ambulatory Visit: Payer: Medicare Other | Attending: Family Medicine | Admitting: Family Medicine

## 2022-05-04 ENCOUNTER — Encounter: Payer: Self-pay | Admitting: Family Medicine

## 2022-05-04 VITALS — BP 138/79 | HR 89 | Temp 97.8°F | Ht 66.0 in | Wt 260.4 lb

## 2022-05-04 DIAGNOSIS — F17210 Nicotine dependence, cigarettes, uncomplicated: Secondary | ICD-10-CM

## 2022-05-04 DIAGNOSIS — Z01419 Encounter for gynecological examination (general) (routine) without abnormal findings: Secondary | ICD-10-CM | POA: Insufficient documentation

## 2022-05-04 DIAGNOSIS — Z0000 Encounter for general adult medical examination without abnormal findings: Secondary | ICD-10-CM

## 2022-05-04 DIAGNOSIS — Z1211 Encounter for screening for malignant neoplasm of colon: Secondary | ICD-10-CM | POA: Diagnosis not present

## 2022-05-04 DIAGNOSIS — Z124 Encounter for screening for malignant neoplasm of cervix: Secondary | ICD-10-CM | POA: Diagnosis not present

## 2022-05-04 DIAGNOSIS — Z1151 Encounter for screening for human papillomavirus (HPV): Secondary | ICD-10-CM | POA: Insufficient documentation

## 2022-05-04 DIAGNOSIS — Z1231 Encounter for screening mammogram for malignant neoplasm of breast: Secondary | ICD-10-CM

## 2022-05-04 NOTE — Patient Instructions (Signed)
  Ms. Manrique , Thank you for taking time to come for your Medicare Wellness Visit. I appreciate your ongoing commitment to your health goals. Please review the following plan we discussed and let me know if I can assist you in the future.   These are the goals we discussed:  Goals      Exercise 150 min/wk Moderate Activity        This is a list of the screening recommended for you and due dates:  Health Maintenance  Topic Date Due   COVID-19 Vaccine (1) Never done   Colon Cancer Screening  Never done   Pap Smear  01/31/2020   Tetanus Vaccine  03/31/2023*   Flu Shot  Completed   Hepatitis C Screening: USPSTF Recommendation to screen - Ages 18-79 yo.  Completed   HIV Screening  Completed   HPV Vaccine  Aged Out  *Topic was postponed. The date shown is not the original due date.

## 2022-05-04 NOTE — Progress Notes (Signed)
Subjective:   Vanessa Wood is a 51 y.o. female who presents for Medicare Annual (Subsequent) preventive examination.  Review of Systems    General: negative for fever, weight loss, appetite change Eyes: no visual symptoms. ENT: no ear symptoms, no sinus tenderness, no nasal congestion or sore throat. Neck: no pain  Respiratory: no wheezing, shortness of breath, cough Cardiovascular: no chest pain, no dyspnea on exertion, no pedal edema, no orthopnea. Gastrointestinal: no abdominal pain, no diarrhea, no constipation Genito-Urinary: no urinary frequency, no dysuria, no polyuria. Hematologic: no bruising Endocrine: no cold or heat intolerance Neurological: no headaches, no seizures, no tremors Musculoskeletal: no joint pains, no joint swelling Skin: no pruritus, no rash. Psychological: no depression, no anxiety,  Cardiac Risk Factors include: none     Objective:    Today's Vitals   05/04/22 1003  BP: 138/79  Pulse: 89  Temp: 97.8 F (36.6 C)  TempSrc: Oral  SpO2: 97%  Weight: 260 lb 6.4 oz (118.1 kg)  Height: 5\' 6"  (1.676 m)  PainSc: 3    Body mass index is 42.03 kg/m.     05/04/2022   10:06 AM 01/30/2017    9:09 AM 11/28/2016    8:37 AM 04/07/2016   10:49 AM 03/25/2016    7:54 AM 12/18/2015    9:52 AM 07/06/2014   12:36 PM  Advanced Directives  Does Patient Have a Medical Advance Directive? No No No No No No No  Would patient like information on creating a medical advance directive? Yes (ED - Information included in AVS)  No - Patient declined No - patient declined information No - patient declined information No - patient declined information     Current Medications (verified) Outpatient Encounter Medications as of 05/04/2022  Medication Sig   albuterol (VENTOLIN HFA) 108 (90 Base) MCG/ACT inhaler Inhale 1 puff into the lungs every 6 (six) hours as needed for wheezing or shortness of breath.   ARIPiprazole (ABILIFY) 9.75 MG/1.3ML injection Inject into the muscle  every 30 (thirty) days.   gabapentin (NEURONTIN) 300 MG capsule Take 1 capsule (300 mg total) by mouth 2 (two) times daily.   omeprazole (PRILOSEC) 20 MG capsule Take 1 tablet by mouth twice daily.   traZODone (DESYREL) 50 MG tablet Take 50 mg by mouth at bedtime.   LITHIUM PO Take 1 tablet by mouth 2 (two) times daily. (Patient not taking: Reported on 03/30/2022)   methylPREDNISolone (MEDROL) 4 MG tablet 6 day taper to be taken as directed.  Start January 31, 2019. (Patient not taking: Reported on 03/30/2022)   Venlafaxine HCl (EFFEXOR PO) Take 1 tablet by mouth daily. (Patient not taking: Reported on 03/30/2022)   Facility-Administered Encounter Medications as of 05/04/2022  Medication   0.9 %  sodium chloride infusion   methylPREDNISolone acetate (DEPO-MEDROL) injection 80 mg    Allergies (verified) Codeine   History: Past Medical History:  Diagnosis Date   Allergy    Anxiety    Arthritis    Asthma    Bipolar 1 disorder (HCC)    Borderline personality disorder ([place])    Cataract    Cervical dysphagia    age 61   Diverticulitis    GERD (gastroesophageal reflux disease)    H/O degenerative disc disease    Major depressive disorder    Peptic ulcer disease    PTSD (post-traumatic stress disorder)    Sciatic leg pain    Substance abuse ([place])    Past Surgical History:  Procedure Laterality Date  cervical dysphagia cell removed     age 41   RETINAL DETACHMENT SURGERY  1985   UPPER GASTROINTESTINAL ENDOSCOPY     Family History  Problem Relation Age of Onset   Emphysema Maternal Grandmother    Lung cancer Maternal Grandmother        smoker   Pancreatic cancer Maternal Grandmother    Pancreatic cancer Maternal Grandfather    Throat cancer Maternal Grandfather        smoker   Colon polyps Maternal Grandfather    Other Mother        borderline diabetic   GER disease Mother    Fibromyalgia Mother    Osteoporosis Mother    COPD Mother    Depression Mother    Colon polyps  Mother        benign   Diverticulosis Mother    Suicidality Father    Other Daughter        accidently overdose   Colon cancer Neg Hx    Rectal cancer Neg Hx    Esophageal cancer Neg Hx    Stomach cancer Neg Hx    Social History   Socioeconomic History   Marital status: Single    Spouse name: Not on file   Number of children: 2   Years of education: Not on file   Highest education level: Not on file  Occupational History   Occupation: disabled  Tobacco Use   Smoking status: Every Day    Packs/day: 1.00    Years: 31.00    Total pack years: 31.00    Types: Cigarettes   Smokeless tobacco: Never   Tobacco comments:    decrease smoking to 0.5 ppd  Vaping Use   Vaping Use: Never used  Substance and Sexual Activity   Alcohol use: Not Currently    Comment: quit years ago   Drug use: Yes    Types: Marijuana, Cocaine    Comment: no cocaine or marijuana in last years   Sexual activity: Yes    Birth control/protection: None  Other Topics Concern   Not on file  Social History Narrative   Not on file   Social Determinants of Health   Financial Resource Strain: Low Risk  (05/04/2022)   Overall Financial Resource Strain (CARDIA)    Difficulty of Paying Living Expenses: Not very hard  Food Insecurity: No Food Insecurity (05/04/2022)   Hunger Vital Sign    Worried About Running Out of Food in the Last Year: Never true    Ran Out of Food in the Last Year: Never true  Transportation Needs: No Transportation Needs (05/04/2022)   PRAPARE - Hydrologist (Medical): No    Lack of Transportation (Non-Medical): No  Physical Activity: Inactive (05/04/2022)   Exercise Vital Sign    Days of Exercise per Week: 0 days    Minutes of Exercise per Session: 0 min  Stress: Stress Concern Present (05/04/2022)   [place]    Feeling of Stress : To some extent  Social Connections: Socially Isolated  (05/04/2022)   Social Connection and Isolation Panel [NHANES]    Frequency of Communication with Friends and Family: More than three times a week    Frequency of Social Gatherings with Friends and Family: Twice a week    Attends Religious Services: Never    Marine scientist or Organizations: No    Attends Archivist Meetings: Never  Marital Status: Never married    Tobacco Counseling Ready to quit: Not Answered Counseling given: Not Answered Tobacco comments: decrease smoking to 0.5 ppd   Clinical Intake:  Pre-visit preparation completed: No  Pain : 0-10 Pain Score: 3  Pain Location: Back Pain Orientation: Lower Pain Descriptors / Indicators: Aching Pain Onset: More than a month ago Pain Frequency: Constant     Diabetes: No  How often do you need to have someone help you when you read instructions, pamphlets, or other written materials from your doctor or pharmacy?: 1 - Never  Diabetic?No  Interpreter Needed?: No      Activities of Daily Living    05/04/2022   10:06 AM  In your present state of health, do you have any difficulty performing the following activities:  Hearing? 0  Vision? 1  Difficulty concentrating or making decisions? 1  Walking or climbing stairs? 1  Dressing or bathing? 0  Doing errands, shopping? 0  Preparing Food and eating ? N  Using the Toilet? N  In the past six months, have you accidently leaked urine? Y  Do you have problems with loss of bowel control? N  Managing your Medications? N  Managing your Finances? N  Housekeeping or managing your Housekeeping? N    Patient Care Team: Charlott Rakes, MD as PCP - General (Family Medicine)  Indicate any recent Medical Services you may have received from other than [facility] providers in the past year (date may be approximate).     Assessment:   This is a routine wellness examination for Lajeanna.  Hearing/Vision screen No results found.  Dietary issues and exercise  activities discussed: Current Exercise Habits: The patient does not participate in regular exercise at present, Exercise limited by: None identified   Goals Addressed   None    Depression Screen    05/04/2022   10:11 AM 03/30/2022   11:14 AM 11/23/2017   10:34 AM 11/02/2017    9:27 AM 10/11/2017   10:26 AM 07/04/2017    9:16 AM 01/30/2017    9:11 AM  PHQ 2/9 Scores  PHQ - 2 Score 2 2 2 2 2 6 2   PHQ- 9 Score 7 8 9 9 8 27 9     Fall Risk    05/04/2022   10:06 AM 03/30/2022   11:14 AM 11/23/2017   10:34 AM 11/02/2017    9:27 AM 01/30/2017    9:11 AM  Fall Risk   Falls in the past year? 0 0 No No Yes  Number falls in past yr: 0 0   1  Injury with Fall? 0 0   Yes  Risk for fall due to : No Fall Risks No Fall Risks     Follow up     Falls prevention discussed    FALL RISK PREVENTION PERTAINING TO THE HOME:  Any stairs in or around the home? No  If so, are there any without handrails? No  Home free of loose throw rugs in walkways, pet beds, electrical cords, etc? Yes  Adequate lighting in your home to reduce risk of falls? Yes   ASSISTIVE DEVICES UTILIZED TO PREVENT FALLS:  Life alert? No  Use of a cane, walker or w/c? No  Grab bars in the bathroom? No  Shower chair or bench in shower? No  Elevated toilet seat or a handicapped toilet? No   TIMED UP AND GO:  Was the test performed? Yes .  Length of time to ambulate 10 feet: 8  sec.   Gait slow and steady without use of assistive device  Cognitive Function:        05/04/2022   10:07 AM  6CIT Screen  What Year? 0 points  What month? 0 points  What time? 0 points  Count back from 20 0 points  Months in reverse 0 points  Repeat phrase 0 points  Total Score 0 points    Immunizations Immunization History  Administered Date(s) Administered   Influenza Split 04/26/2011   Influenza Whole 05/12/2009   Influenza,inj,Quad PF,6+ Mos 04/07/2016, 07/04/2017, 03/30/2022   Tdap 04/26/2011    Screening Tests Health Maintenance   Topic Date Due   COVID-19 Vaccine (1) Never done   COLONOSCOPY (Pts 45-9yrs Insurance coverage will need to be confirmed)  Never done   PAP SMEAR-Modifier  01/31/2020   TETANUS/TDAP  03/31/2023 (Originally 04/25/2021)   INFLUENZA VACCINE  Completed   Hepatitis C Screening  Completed   HIV Screening  Completed   HPV VACCINES  Aged Out    Health Maintenance  Health Maintenance Due  Topic Date Due   COVID-19 Vaccine (1) Never done   COLONOSCOPY (Pts 45-74yrs Insurance coverage will need to be confirmed)  Never done   PAP SMEAR-Modifier  01/31/2020    Colorectal cancer screening: Type of screening: Colonoscopy. Completed orderd today. Repeat every 10 years  Mammogram status: Ordered today. Pt provided with contact info and advised to call to schedule appt.   Additional Screening:  Hepatitis C Screening: does qualify; Completed 03/03/22  Vision Screening: Recommended annual ophthalmology exams for early detection of glaucoma and other disorders of the eye. Is the patient up to date with their annual eye exam?  No  Who is the provider or what is the name of the office in which the patient attends annual eye exams? Not applicable If pt is not established with a provider, would they like to be referred to a provider to establish care? No .   Dental Screening: Recommended annual dental exams for proper oral hygiene   Constitutional: normal appearing,  Eyes: PERRLA HEENT: Head is atraumatic, normal sinuses, normal oropharynx, normal appearing tonsils and palate, tympanic membrane is normal bilaterally. Neck: normal range of motion, no thyromegaly, no JVD Cardiovascular: normal rate and rhythm, normal heart sounds, no murmurs, rub or gallop, no pedal edema Respiratory: Normal breath sounds, clear to auscultation bilaterally, no wheezes, no rales, no rhonchi Breast: No tenderness, no masses, no lymphadenopathy Abdomen: soft, not tender to palpation, normal bowel sounds, no enlarged  organs Musculoskeletal: Full ROM, no tenderness in joints Genitourinary: External genitalia, vagina, cervix, adnexa-normal Skin: warm and dry, no lesions. Neurological: alert, oriented x3, cranial nerves I-XII grossly intact , normal motor strength, normal sensation. Psychological: normal mood.  Community Resource Referral / Chronic Care Management: CRR required this visit?  No   CCM required this visit?  No      Plan:   1. Encounter for Medicare annual wellness exam Counseled on 150 minutes of exercise per week, healthy eating (including decreased daily intake of saturated fats, cholesterol, added sugars, sodium), routine healthcare maintenance. A copy of the advance directives have been provided for her.  2. Encounter for screening mammogram for malignant neoplasm of breast - MM 3D SCREEN BREAST BILATERAL; Future  3. Special screening for malignant neoplasms, colon - Ambulatory referral to Gastroenterology  4. Screening for malignant neoplasm of cervix - Cytology - PAP   I have personally reviewed and noted the following in the patient's chart:  Medical and social history Use of alcohol, tobacco or illicit drugs  Current medications and supplements including opioid prescriptions. Patient is not currently taking opioid prescriptions. Functional ability and status Nutritional status Physical activity Advanced directives List of other physicians Hospitalizations, surgeries, and ER visits in previous 12 months Vitals Screenings to include cognitive, depression, and falls Referrals and appointments  In addition, I have reviewed and discussed with patient certain preventive protocols, quality metrics, and best practice recommendations. A written personalized care plan for preventive services as well as general preventive health recommendations were provided to patient.     Charlott Rakes, MD   05/04/2022

## 2022-05-09 ENCOUNTER — Other Ambulatory Visit: Payer: Self-pay | Admitting: Family Medicine

## 2022-05-09 DIAGNOSIS — R87612 Low grade squamous intraepithelial lesion on cytologic smear of cervix (LGSIL): Secondary | ICD-10-CM

## 2022-05-09 LAB — CYTOLOGY - PAP
Comment: NEGATIVE
High risk HPV: NEGATIVE

## 2022-05-10 ENCOUNTER — Telehealth: Payer: Self-pay

## 2022-05-10 NOTE — Telephone Encounter (Signed)
-----   Message from Charlott Rakes, MD sent at 05/09/2022 10:38 AM EDT ----- Please inform her that her Pap smear reveals low-grade abnormal cells and I have referred her to GYN for further testing.  Thank you

## 2022-05-10 NOTE — Telephone Encounter (Signed)
Pt was called and is aware of results, DOB was confirmed.   Referral information given as well

## 2022-05-16 ENCOUNTER — Ambulatory Visit: Payer: Self-pay

## 2022-05-16 NOTE — Telephone Encounter (Signed)
  Chief Complaint: advice Symptoms: NA Frequency: 06/21/22 procedure for colposcopy Pertinent Negatives: Patient denies having periods  Disposition: [] ED /[] Urgent Care (no appt availability in office) / [] Appointment(In office/virtual)/ []  [place] [facility]/ [x] Home Care/ [] Refused Recommended Disposition /[]  Mobile Bus/ []  Follow-up with PCP Additional Notes: pt wanted more information on colposcopy she is having done on 06/21/22. Pt was advised of information that I read and reviewed. Pt had no further questions at this time.   Summary: pls explain my procedure on 11/21   [facility].  Her proceduree is 11/21.  She said they will/cannot explain what this procedure is, and she would like a nurse to call her back and explain what this is she is going to have done      Reason for Disposition  Question about upcoming scheduled test, no triage required and triager able to answer question  Answer Assessment - Initial Assessment Questions 1. REASON FOR CALL or QUESTION: "What is your reason for calling today?" or "How can I best help you?" or "What question do you have that I can help answer?"     Wanting to information on procedure colposcopy.  Protocols used: Information Only Call - No Triage-A-AH

## 2022-06-21 ENCOUNTER — Encounter: Payer: Self-pay | Admitting: Obstetrics and Gynecology

## 2022-06-21 ENCOUNTER — Other Ambulatory Visit: Payer: Self-pay

## 2022-06-21 ENCOUNTER — Ambulatory Visit (INDEPENDENT_AMBULATORY_CARE_PROVIDER_SITE_OTHER): Payer: Medicare Other | Admitting: Obstetrics and Gynecology

## 2022-06-21 VITALS — BP 137/75 | HR 72 | Ht 66.0 in | Wt 254.2 lb

## 2022-06-21 DIAGNOSIS — R87612 Low grade squamous intraepithelial lesion on cytologic smear of cervix (LGSIL): Secondary | ICD-10-CM

## 2022-06-21 NOTE — Progress Notes (Signed)
    GYNECOLOGY VISIT  Patient name: Vanessa Wood MRN [id number]  Date of birth: 1971-03-05 Chief Complaint:   Follow-up  History:  Vanessa Wood is a 51 y.o. No obstetric history on file. being seen today for abnormal pap. Has not had any bleeding - LMP 1.5 years ago.     Past Medical History:  Diagnosis Date   Allergy    Anxiety    Arthritis    Asthma    Bipolar 1 disorder (HCC)    Borderline personality disorder (HCC)    Cataract    Cervical dysphagia    age 7   Diverticulitis    GERD (gastroesophageal reflux disease)    H/O degenerative disc disease    Major depressive disorder    Peptic ulcer disease    PTSD (post-traumatic stress disorder)    Sciatic leg pain    Substance abuse (HCC)     Past Surgical History:  Procedure Laterality Date   cervical dysphagia cell removed     age 43   RETINAL DETACHMENT SURGERY  1985   UPPER GASTROINTESTINAL ENDOSCOPY      The following portions of the patient's history were reviewed and updated as appropriate: allergies, current medications, past family history, past medical history, past social history, past surgical history and problem list.   Health Maintenance:   Last pap 05/2022 LSIL, HRHPV neg;    Review of Systems:  Pertinent items are noted in HPI. Comprehensive review of systems was otherwise negative.   Objective:  Physical Exam BP 137/75   Pulse 72   Ht 5\' 6"  (1.676 m)   Wt 254 lb 3.2 oz (115.3 kg)   LMP  (LMP Unknown) Comment: patient reports hasnt had period in 2 years  BMI 41.03 kg/m    Physical Exam Vitals and nursing note reviewed.  Constitutional:      Appearance: Normal appearance.  HENT:     Head: Normocephalic and atraumatic.  Pulmonary:     Effort: Pulmonary effort is normal.  Skin:    General: Skin is warm and dry.  Neurological:     General: No focal deficit present.     Mental Status: She is alert.  Psychiatric:        Mood and Affect: Mood normal.        Behavior: Behavior  normal.        Thought Content: Thought content normal.        Judgment: Judgment normal.   Labs and Imaging No results found.     Assessment & Plan:   1. LGSIL on Pap smear of cervix Per ASCCP guidelines, recommend repeat pap smear in 1 year.    Routine preventative health maintenance measures emphasized.  , MD [facility], [facility]

## 2022-07-06 ENCOUNTER — Ambulatory Visit: Payer: Medicare Other

## 2022-07-22 ENCOUNTER — Ambulatory Visit
Admission: RE | Admit: 2022-07-22 | Discharge: 2022-07-22 | Disposition: A | Discharge status: Home / Self Care | Payer: Medicare Other | Source: Ambulatory Visit | Attending: Family Medicine | Admitting: Family Medicine

## 2022-07-22 DIAGNOSIS — Z1231 Encounter for screening mammogram for malignant neoplasm of breast: Secondary | ICD-10-CM

## 2022-11-08 ENCOUNTER — Ambulatory Visit: Payer: Medicare Other | Admitting: Family Medicine

## 2022-12-19 ENCOUNTER — Telehealth: Payer: Self-pay

## 2022-12-19 NOTE — Telephone Encounter (Signed)
   Telephone encounter was:  Successful.  12/19/2022 Name: Vanessa Wood MRN: [id number] DOB: September 17, 1970  Vanessa Wood is a 52 y.o. year old female who is a primary care patient of Hoy Register, MD . The community resource team was consulted for assistance with  Colorectal Cancer Screening  Care guide performed the following interventions: Patient provided with information about care guide support team and interviewed to confirm resource needs.Patient stated she isnt ready to go through all of the testing at this time but will reach out and make an appointment when its a good time for her   Follow Up Plan:  No further follow up planned at this time. The patient has been provided with needed resources.    Lenard Forth [facility] Guide, [facility] [phone] [address] [place], [place], [place] [address] Phone: [phone] Email: [email or web address]

## 2023-03-01 ENCOUNTER — Ambulatory Visit: Payer: Self-pay

## 2023-03-01 NOTE — Telephone Encounter (Signed)
Chief Complaint: Sore throat Symptoms: Mild sore throat that hurts all the time, blurry vision not related to sore throat Frequency: onset around 4 months Pertinent Negatives: Patient denies other symptoms Disposition: [] ED /[] Urgent Care (no appt availability in office) / [x] Appointment(In office/virtual)/ []  [place] [facility]/ [] Home Care/ [] Refused Recommended Disposition /[] [place] [facility]/ []  Follow-up with PCP Additional Notes: Patient voiced concerns of having throat cancer and would like to be tested for that. Advised earliest with PCP is October. She says she will take that if no appointments sooner with anyone else. Advised Thursday 03/16/23 with Georgian Co, PA-C is available, she agreed to the appointment.    Summary: throat pain going on for about four months.   Pt stated she has been having throat pain going on for about four months. She stated that she is afraid that it can be cancer because she has a family history of throat cancer.  Pt requesting an appointment no apportionments available.  Seeking clinical advice         Reason for Disposition  [1] Sore throat is the only symptom AND [2] present > 48 hours  Answer Assessment - Initial Assessment Questions 1. ONSET: "When did the throat start hurting?" (Hours or days ago)      4 months ago 2. SEVERITY: "How bad is the sore throat?" (Scale 1-10; mild, moderate or severe)   - MILD (1-3):  Doesn't interfere with eating or normal activities.   - MODERATE (4-7): Interferes with eating some solids and normal activities.   - SEVERE (8-10):  Excruciating pain, interferes with most normal activities.   - SEVERE WITH DYSPHAGIA (10): Can't swallow liquids, drooling.     Mild 3.  VIRAL SYMPTOMS: "Are there any symptoms of a cold, such as a runny nose, cough, hoarse voice or red eyes?"      No 5. FEVER: "Do you have a fever?" If Yes, ask: "What is your temperature, how was it measured, and when did it start?"      No 6. OTHER SYMPTOMS: "Do you have any other symptoms?" (e.g., difficulty breathing, headache, rash)     No  Protocols used: Sore Throat-A-AH

## 2023-03-16 ENCOUNTER — Ambulatory Visit: Payer: Medicare Other | Attending: Physician Assistant | Admitting: Physician Assistant

## 2023-03-16 ENCOUNTER — Encounter: Payer: Self-pay | Admitting: Physician Assistant

## 2023-03-16 VITALS — BP 131/85 | HR 60 | Temp 97.9°F | Wt 249.8 lb

## 2023-03-16 DIAGNOSIS — Z808 Family history of malignant neoplasm of other organs or systems: Secondary | ICD-10-CM | POA: Diagnosis not present

## 2023-03-16 DIAGNOSIS — Z716 Tobacco abuse counseling: Secondary | ICD-10-CM | POA: Diagnosis not present

## 2023-03-16 DIAGNOSIS — F17210 Nicotine dependence, cigarettes, uncomplicated: Secondary | ICD-10-CM | POA: Diagnosis not present

## 2023-03-16 DIAGNOSIS — R070 Pain in throat: Secondary | ICD-10-CM | POA: Insufficient documentation

## 2023-03-16 DIAGNOSIS — Z1331 Encounter for screening for depression: Secondary | ICD-10-CM | POA: Insufficient documentation

## 2023-03-16 DIAGNOSIS — Z79899 Other long term (current) drug therapy: Secondary | ICD-10-CM | POA: Insufficient documentation

## 2023-03-16 MED ORDER — BUPROPION HCL ER (SR) 150 MG PO TB12: 150.0000 mg | ORAL_TABLET | Freq: Two times a day (BID) | ORAL | 1 refills | Status: DC

## 2023-03-16 NOTE — Progress Notes (Signed)
Patient ID: Vanessa Wood, female   DOB: 1971/05/29, 52 y.o.   MRN: [id number]   Vanessa Wood, is a 52 y.o. female  OVF:[id number]  [id number]:[id number]  DOB - 11-11-1970  Chief Complaint  Patient presents with   Cough   Sore Throat       Subjective:   Vanessa Wood is a 52 y.o. female here today for concern with throat pain for a few months.  No weight loss.  No fevers.  No night sweats.  No dysphagia.  Appetite is good.  She is a 1ppd smoker since age 48.  She does have chronic congestion.  She does not drink any water.  Says she can't quit smoking bc she can't afford meds.  +depression screening with #9=0 on PHQ-9.  No hemoptysis.  No fevers.  She is mostly concerned bc her grandfather had throat cancer  No problems updated.  ALLERGIES: Allergies  Allergen Reactions   Codeine Hives    PAST MEDICAL HISTORY: Past Medical History:  Diagnosis Date   Allergy    Anxiety    Arthritis    Asthma    Bipolar 1 disorder (HCC)    Borderline personality disorder (HCC)    Cataract    Cervical dysphagia    age 35   Diverticulitis    GERD (gastroesophageal reflux disease)    H/O degenerative disc disease    Major depressive disorder    Peptic ulcer disease    PTSD (post-traumatic stress disorder)    Sciatic leg pain    Substance abuse (HCC)     MEDICATIONS AT HOME: Prior to Admission medications   Medication Sig Start Date End Date Taking? Authorizing Provider  albuterol (VENTOLIN HFA) 108 (90 Base) MCG/ACT inhaler Inhale 1 puff into the lungs every 6 (six) hours as needed for wheezing or shortness of breath. 03/30/22  Yes Newlin, Odette Horns, MD  ARIPiprazole (ABILIFY) 9.75 MG/1.3ML injection Inject into the muscle every 30 (thirty) days.   Yes [provider]  buPROPion (WELLBUTRIN SR) 150 MG 12 hr tablet Take 1 tablet (150 mg total) by mouth 2 (two) times daily. 03/16/23  Yes Georgian Co M, PA-C  chlorhexidine (PERIDEX) 0.12 % solution SMARTSIG:Milliliter(s) By Mouth  Twice Daily 04/26/22  Yes [provider]  gabapentin (NEURONTIN) 300 MG capsule Take 1 capsule (300 mg total) by mouth 2 (two) times daily. 11/23/17  Yes Hoy Register, MD  lamoTRIgine (LAMICTAL) 25 MG tablet Take 50 mg by mouth daily. 05/27/22  Yes [provider]  omeprazole (PRILOSEC) 20 MG capsule Take 1 tablet by mouth twice daily. 03/30/22  Yes Hoy Register, MD  Venlafaxine HCl (EFFEXOR PO) Take 1 tablet by mouth daily.   Yes [provider]  LITHIUM PO Take 1 tablet by mouth 2 (two) times daily. Patient not taking: Reported on 03/30/2022    [provider]  methylPREDNISolone (MEDROL) 4 MG tablet 6 day taper to be taken as directed.  Start January 31, 2019. Patient not taking: Reported on 03/30/2022 01/31/19   Naida Sleight, PA-C  traZODone (DESYREL) 50 MG tablet Take 50 mg by mouth at bedtime. Patient not taking: Reported on 03/16/2023    [provider]    ROS: Neg resp Neg cardiac Neg GI Neg GU Neg MS Neg psych Neg neuro  Objective:   Vitals:   03/16/23 1108  BP: 131/85  Pulse: 60  Temp: 97.9 F (36.6 C)  SpO2: 97%  Weight: 249 lb 12.8 oz (113.3 kg)   Exam General  appearance : Awake, alert, not in any distress. Speech Clear. Not toxic looking HEENT: Atraumatic and Normocephalic.  B TM congested and turbinates inflamed.  Mucus membranes are DRY! No abnormality of posterior pharynx.  Uvula midline.  Neck: Supple, no JVD. No cervical lymphadenopathy.  Chest: Good air entry bilaterally, CTAB.  No rales/rhonchi/wheezing CVS: S1 S2 regular, no murmurs.  Extremities: B/L Lower Ext shows no edema, both legs are warm to touch.   Neurology: Awake alert, and oriented X 3, CN II-XII intact, Non focal Skin: No Rash  Data Review Lab Results  Component Value Date   HGBA1C 5.7 (H) 03/30/2022    Assessment & Plan   1. Throat pain Likely due to dehydration and smoking/chronic irritation.  She does have +FH throat CA.  - Ambulatory  referral to ENT  2. Positive depression screening This will help with depression and smoking cessation - buPROPion (WELLBUTRIN SR) 150 MG 12 hr tablet; Take 1 tablet (150 mg total) by mouth 2 (two) times daily.  Dispense: 180 tablet; Refill: 1  3. Smoking greater than 40 pack years Smoking and dangers of nicotine have been discussed at length. Long term health consequences of smoking reviewed in detail.  Methods for helping with cessation have been reviewed.  Patient expresses understanding. - Ambulatory referral to ENT    Return in about 7 weeks (around 05/04/2023) for Dr Alvis Lemmings for depression and smoking follow up.  The patient was given clear instructions to go to ER or return to medical center if symptoms don't improve, worsen or new problems develop. The patient verbalized understanding. The patient was told to call to get lab results if they haven't heard anything in the next week.      Georgian Co, PA-C [facility] [place], [place] [phone]   03/16/2023, 12:32 PM

## 2023-03-16 NOTE — Patient Instructions (Addendum)
Drink 80 ounces water daily   Managing Depression, Adult Depression is a mental health condition that affects your thoughts, feelings, and actions. Being diagnosed with depression can bring you relief if you did not know why you have felt or behaved a certain way. It could also leave you feeling overwhelmed. Finding ways to manage your symptoms can help you feel more positive about your future. How to manage lifestyle changes Being depressed is difficult. Depression can increase the level of everyday stress. Stress can make depression symptoms worse. You may believe your symptoms cannot be managed or will never improve. However, there are many things you can try to help manage your symptoms. There is hope. Managing stress  Stress is your body's reaction to life changes and events, both good and bad. Stress can add to your feelings of depression. Learning to manage your stress can help lessen your feelings of depression. Try some of the following approaches to reducing your stress (stress reduction techniques): Listen to music that you enjoy and that inspires you. Try using a meditation app or take a meditation class. Develop a practice that helps you connect with your spiritual self. Walk in nature, pray, or go to a place of worship. Practice deep breathing. To do this, inhale slowly through your nose. Pause at the top of your inhale for a few seconds and then exhale slowly, letting yourself relax. Repeat this three or four times. Practice yoga to help relax and work your muscles. Choose a stress reduction technique that works for you. These techniques take time and practice to develop. Set aside 5-15 minutes a day to do them. Therapists can offer training in these techniques. Do these things to help manage stress: Keep a journal. Know your limits. Set healthy boundaries for yourself and others, such as saying "no" when you think something is too much. Pay attention to how you react to certain  situations. You may not be able to control everything, but you can change your reaction. Add humor to your life by watching funny movies or shows. Make time for activities that you enjoy and that relax you. Spend less time using electronics, especially at night before bed. The light from screens can make your brain think it is time to get up rather than go to bed.  Medicines Medicines, such as antidepressants, are often a part of treatment for depression. Talk with your pharmacist or health care provider about all the medicines, supplements, and herbal products that you take, their possible side effects, and what medicines and other products are safe to take together. Make sure to report any side effects you may have to your health care provider. Relationships Your health care provider may suggest family therapy, couples therapy, or individual therapy as part of your treatment. How to recognize changes Everyone responds differently to treatment for depression. As you recover from depression, you may start to: Have more interest in doing activities. Feel more hopeful. Have more energy. Eat a more regular amount of food. Have better mental focus. It is important to recognize if your depression is not getting better or is getting worse. The symptoms you had in the beginning may return, such as: Feeling tired. Eating too much or too little. Sleeping too much or too little. Feeling restless, agitated, or hopeless. Trouble focusing or making decisions. Having unexplained aches and pains. Feeling irritable, angry, or aggressive. If you or your family members notice these symptoms coming back, let your health care provider know right away. Follow  these instructions at home: Activity Try to get some form of exercise each day, such as walking. Try yoga, mindfulness, or other stress reduction techniques. Participate in group activities if you are able. Lifestyle Get enough sleep. Cut down on  or stop using caffeine, tobacco, alcohol, and any other harmful substances. Eat a healthy diet that includes plenty of vegetables, fruits, whole grains, low-fat dairy products, and lean protein. Limit foods that are high in solid fats, added sugar, or salt (sodium). General instructions Take over-the-counter and prescription medicines only as told by your health care provider. Keep all follow-up visits. It is important for your health care provider to check on your mood, behavior, and medicines. Your health care provider may need to make changes to your treatment. Where to find support Talking to others  Friends and family members can be sources of support and guidance. Talk to trusted friends or family members about your condition. Explain your symptoms and let them know that you are working with a health care provider to treat your depression. Tell friends and family how they can help. Finances Find mental health providers that fit with your financial situation. Talk with your health care provider if you are worried about access to food, housing, or medicine. Call your insurance company to learn about your co-pays and prescription plan. Where to find more information You can find support in your area from: Anxiety and Depression Association of America (ADAA): adaa.org Mental Health [place]: mentalhealthamerica.net [employer] on Mental Illness: nami.org Contact a health care provider if: You stop taking your antidepressant medicines, and you have any of these symptoms: Nausea. Headache. Light-headedness. Chills and body aches. Not being able to sleep (insomnia). You or your friends and family think your depression is getting worse. Get help right away if: You have thoughts of hurting yourself or others. Get help right away if you feel like you may hurt yourself or others, or have thoughts about taking your own life. Go to your nearest emergency room or: Call 911. Call the  National Suicide Prevention Lifeline at (409)605-2764 or 988. This is open 24 hours a day. Text the Crisis Text Line at 402-055-9310. This information is not intended to replace advice given to you by your health care provider. Make sure you discuss any questions you have with your health care provider. Document Revised: 11/23/2021 Document Reviewed: 11/23/2021 Elsevier Patient Education  2024 [employer] Managing the Challenge of Quitting Smoking Quitting smoking is a physical and mental challenge. You may have cravings, withdrawal symptoms, and temptation to smoke. Before quitting, work with your health care provider to make a plan that can help you manage quitting. Making a plan before you quit may keep you from smoking when you have the urge to smoke while trying to quit. How to manage lifestyle changes Managing stress Stress can make you want to smoke, and wanting to smoke may cause stress. It is important to find ways to manage your stress. You could try some of the following: Practice relaxation techniques. Breathe slowly and deeply, in through your nose and out through your mouth. Listen to music. Soak in a bath or take a shower. Imagine a peaceful place or vacation. Get some support. Talk with family or friends about your stress. Join a support group. Talk with a counselor or therapist. Get some physical activity. Go for a walk, run, or bike ride. Play a favorite sport. Practice yoga.  Medicines Talk with your health care provider about medicines that might help  you deal with cravings and make quitting easier for you. Relationships Social situations can be difficult when you are quitting smoking. To manage this, you can: Avoid parties and other social situations where people might be smoking. Avoid alcohol. Leave right away if you have the urge to smoke. Explain to your family and friends that you are quitting smoking. Ask for support and let them know you might be a bit  grumpy. Plan activities where smoking is not an option. General instructions Be aware that many people gain weight after they quit smoking. However, not everyone does. To keep from gaining weight, have a plan in place before you quit, and stick to the plan after you quit. Your plan should include: Eating healthy snacks. When you have a craving, it may help to: Eat popcorn, or try carrots, celery, or other cut vegetables. Chew sugar-free gum. Changing how you eat. Eat small portion sizes at meals. Eat 4-6 small meals throughout the day instead of 1-2 large meals a day. Be mindful when you eat. You should avoid watching television or doing other things that might distract you as you eat. Exercising regularly. Make time to exercise each day. If you do not have time for a long workout, do short bouts of exercise for 5-10 minutes several times a day. Do some form of strengthening exercise, such as weight lifting. Do some exercise that gets your heart beating and causes you to breathe deeply, such as walking fast, running, swimming, or biking. This is very important. Drinking plenty of water or other low-calorie or no-calorie drinks. Drink enough fluid to keep your urine pale yellow.  How to recognize withdrawal symptoms Your body and mind may experience discomfort as you try to get used to not having nicotine in your system. These effects are called withdrawal symptoms. They may include: Feeling hungrier than normal. Having trouble concentrating. Feeling irritable or restless. Having trouble sleeping. Feeling depressed. Craving a cigarette. These symptoms may surprise you, but they are normal to have when quitting smoking. To manage withdrawal symptoms: Avoid places, people, and activities that trigger your cravings. Remember why you want to quit. Get plenty of sleep. Avoid coffee and other drinks that contain caffeine. These may worsen some of your symptoms. How to manage cravings Come up  with a plan for how to deal with your cravings. The plan should include the following: A definition of the specific situation you want to deal with. An activity or action you will take to replace smoking. A clear idea for how this action will help. The name of someone who could help you with this. Cravings usually last for 5-10 minutes. Consider taking the following actions to help you with your plan to deal with cravings: Keep your mouth busy. Chew sugar-free gum. Suck on hard candies or a straw. Brush your teeth. Keep your hands and body busy. Change to a different activity right away. Squeeze or play with a ball. Do an activity or a hobby, such as making bead jewelry, practicing needlepoint, or working with wood. Mix up your normal routine. Take a short exercise break. Go for a quick walk, or run up and down stairs. Focus on doing something kind or helpful for someone else. Call a friend or family member to talk during a craving. Join a support group. Contact a quitline. Where to find support To get help or find a support group: Call the National Cancer Institute's Smoking Quitline: 1-800-QUIT-NOW (204)135-2291) Text QUIT to SmokefreeTXT: 664403 Where to find  more information Visit these websites to find more information on quitting smoking: [place]. Department of Health and Human Services: www.smokefree.gov American Lung Association: www.freedomfromsmoking.org Centers for Disease Control and Prevention (CDC): FootballExhibition.com.br American Heart Association: www.heart.org Contact a health care provider if: You want to change your plan for quitting. The medicines you are taking are not helping. Your eating feels out of control or you cannot sleep. You feel depressed or become very anxious. Summary Quitting smoking is a physical and mental challenge. You will face cravings, withdrawal symptoms, and temptation to smoke again. Preparation can help you as you go through these challenges. Try  different techniques to manage stress, handle social situations, and prevent weight gain. You can deal with cravings by keeping your mouth busy (such as by chewing gum), keeping your hands and body busy, calling family or friends, or contacting a quitline for people who want to quit smoking. You can deal with withdrawal symptoms by avoiding places where people smoke, getting plenty of rest, and avoiding drinks that contain caffeine. This information is not intended to replace advice given to you by your health care provider. Make sure you discuss any questions you have with your health care provider. Document Revised: 07/09/2021 Document Reviewed: 07/09/2021 Elsevier Patient Education  2024 [employer].

## 2023-05-09 ENCOUNTER — Ambulatory Visit: Payer: Medicare Other | Attending: Family Medicine

## 2023-05-09 VITALS — Ht 66.0 in | Wt 249.0 lb

## 2023-05-09 DIAGNOSIS — Z0000 Encounter for general adult medical examination without abnormal findings: Secondary | ICD-10-CM

## 2023-05-09 NOTE — Progress Notes (Signed)
Subjective:   Vanessa Wood is a 52 y.o. female who presents for Medicare Annual (Subsequent) preventive examination.  Visit Complete: Virtual I connected with  [name] on 05/09/23 by a audio enabled telemedicine application and verified that I am speaking with the correct person using two identifiers.  Patient Location: Home  Provider Location: Home Office  I discussed the limitations of evaluation and management by telemedicine. The patient expressed understanding and agreed to proceed.  Vital Signs: Because this visit was a virtual/telehealth visit, some criteria may be missing or patient reported. Any vitals not documented were not able to be obtained and vitals that have been documented are patient reported.  Cardiac Risk Factors include: none     Objective:    Today's Vitals   05/09/23 0909  Weight: 249 lb (112.9 kg)  Height: 5\' 6"  (1.676 m)   Body mass index is 40.19 kg/m.     05/09/2023   12:59 PM 05/04/2022   10:06 AM 01/30/2017    9:09 AM 11/28/2016    8:37 AM 04/07/2016   10:49 AM 03/25/2016    7:54 AM 12/18/2015    9:52 AM  Advanced Directives  Does Patient Have a Medical Advance Directive? No No No No No No No  Would patient like information on creating a medical advance directive? Yes (MAU/Ambulatory/Procedural Areas - Information given) Yes (ED - Information included in AVS)  No - Patient declined No - patient declined information No - patient declined information No - patient declined information    Current Medications (verified) Outpatient Encounter Medications as of 05/09/2023  Medication Sig   albuterol (VENTOLIN HFA) 108 (90 Base) MCG/ACT inhaler Inhale 1 puff into the lungs every 6 (six) hours as needed for wheezing or shortness of breath.   ARIPiprazole (ABILIFY) 9.75 MG/1.3ML injection Inject into the muscle every 30 (thirty) days.   buPROPion (WELLBUTRIN SR) 150 MG 12 hr tablet Take 1 tablet (150 mg total) by mouth 2 (two) times daily.    chlorhexidine (PERIDEX) 0.12 % solution SMARTSIG:Milliliter(s) By Mouth Twice Daily   gabapentin (NEURONTIN) 300 MG capsule Take 1 capsule (300 mg total) by mouth 2 (two) times daily.   lamoTRIgine (LAMICTAL) 25 MG tablet Take 50 mg by mouth daily.   omeprazole (PRILOSEC) 20 MG capsule Take 1 tablet by mouth twice daily.   [DISCONTINUED] LITHIUM PO Take 1 tablet by mouth 2 (two) times daily.   Venlafaxine HCl (EFFEXOR PO) Take 1 tablet by mouth daily. (Patient not taking: Reported on 05/09/2023)   [DISCONTINUED] methylPREDNISolone (MEDROL) 4 MG tablet 6 day taper to be taken as directed.  Start January 31, 2019. (Patient not taking: Reported on 03/30/2022)   [DISCONTINUED] traZODone (DESYREL) 50 MG tablet Take 50 mg by mouth at bedtime. (Patient not taking: Reported on 03/16/2023)   Facility-Administered Encounter Medications as of 05/09/2023  Medication   0.9 %  sodium chloride infusion   methylPREDNISolone acetate (DEPO-MEDROL) injection 80 mg    Allergies (verified) Codeine   History: Past Medical History:  Diagnosis Date   Allergy    Anxiety    Arthritis    Asthma    Bipolar 1 disorder (HCC)    Borderline personality disorder (HCC)    Cataract    Cervical dysphagia    age 40   Diverticulitis    GERD (gastroesophageal reflux disease)    H/O degenerative disc disease    Major depressive disorder    Peptic ulcer disease    PTSD (post-traumatic stress  disorder)    Sciatic leg pain    Substance abuse (HCC)    Past Surgical History:  Procedure Laterality Date   cervical dysphagia cell removed     age 5   RETINAL DETACHMENT SURGERY  1985   UPPER GASTROINTESTINAL ENDOSCOPY     Family History  Problem Relation Age of Onset   Emphysema Maternal Grandmother    Lung cancer Maternal Grandmother        smoker   Pancreatic cancer Maternal Grandmother    Pancreatic cancer Maternal Grandfather    Throat cancer Maternal Grandfather        smoker   Colon polyps Maternal Grandfather     Other Mother        borderline diabetic   GER disease Mother    Fibromyalgia Mother    Osteoporosis Mother    COPD Mother    Depression Mother    Colon polyps Mother        benign   Diverticulosis Mother    Suicidality Father    Other Daughter        accidently overdose   Colon cancer Neg Hx    Rectal cancer Neg Hx    Esophageal cancer Neg Hx    Stomach cancer Neg Hx    Social History   Socioeconomic History   Marital status: Single    Spouse name: Not on file   Number of children: 2   Years of education: Not on file   Highest education level: Not on file  Occupational History   Occupation: disabled  Tobacco Use   Smoking status: Every Day    Current packs/day: 1.00    Average packs/day: 1 pack/day for 31.0 years (31.0 ttl pk-yrs)    Types: Cigarettes   Smokeless tobacco: Never   Tobacco comments:    decrease smoking to 0.5 ppd  Vaping Use   Vaping status: Never Used  Substance and Sexual Activity   Alcohol use: Not Currently    Comment: quit years ago   Drug use: Yes    Types: Marijuana    Comment: no cocaine in last years   Sexual activity: Not Currently    Birth control/protection: None  Other Topics Concern   Not on file  Social History Narrative   Not on file   Social Determinants of Health   Financial Resource Strain: Low Risk  (05/09/2023)   Overall Financial Resource Strain (CARDIA)    Difficulty of Paying Living Expenses: Not very hard  Food Insecurity: No Food Insecurity (05/09/2023)   Hunger Vital Sign    Worried About Running Out of Food in the Last Year: Never true    Ran Out of Food in the Last Year: Never true  Transportation Needs: No Transportation Needs (05/09/2023)   PRAPARE - Administrator, Civil Service (Medical): No    Lack of Transportation (Non-Medical): No  Physical Activity: Inactive (05/09/2023)   Exercise Vital Sign    Days of Exercise per Week: 0 days    Minutes of Exercise per Session: 0 min  Stress: No  Stress Concern Present (05/09/2023)   [employer] of Occupational Health - Occupational Stress Questionnaire    Feeling of Stress : Only a little  Social Connections: Socially Isolated (05/09/2023)   Social Connection and Isolation Panel [NHANES]    Frequency of Communication with Friends and Family: More than three times a week    Frequency of Social Gatherings with Friends and Family: Twice a week  Attends Religious Services: Never    Active Member of Clubs or Organizations: No    Attends Engineer, structural: Never    Marital Status: Never married    Tobacco Counseling Ready to quit: Not Answered Counseling given: Not Answered Tobacco comments: decrease smoking to 0.5 ppd   Clinical Intake:  Pre-visit preparation completed: Yes  Pain : No/denies pain     Diabetes: No  How often do you need to have someone help you when you read instructions, pamphlets, or other written materials from your doctor or pharmacy?: 1 - Never  Interpreter Needed?: No  Information entered by :: Kandis Fantasia LPN   Activities of Daily Living    05/09/2023   12:58 PM  In your present state of health, do you have any difficulty performing the following activities:  Hearing? 0  Vision? 0  Difficulty concentrating or making decisions? 0  Walking or climbing stairs? 0  Dressing or bathing? 0  Doing errands, shopping? 0  Preparing Food and eating ? N  Using the Toilet? N  In the past six months, have you accidently leaked urine? N  Do you have problems with loss of bowel control? N  Managing your Medications? N  Managing your Finances? N  Housekeeping or managing your Housekeeping? N    Patient Care Team: Hoy Register, MD as PCP - General (Family Medicine) Blanchie Serve any recent Medical Services you may have received from other than [facility] providers in the past year (date may be approximate).     Assessment:   This is a routine wellness examination for  Evelena.  Hearing/Vision screen Hearing Screening - Comments:: Denies hearing difficulties   Vision Screening - Comments:: No vision problems; will schedule routine eye exam soon     Goals Addressed   None   Depression Screen    05/09/2023   12:54 PM 03/16/2023   11:10 AM 06/21/2022    8:45 AM 05/04/2022   10:11 AM 03/30/2022   11:14 AM 11/23/2017   10:34 AM 11/02/2017    9:27 AM  PHQ 2/9 Scores  PHQ - 2 Score 3 5 3 2 2 2 2   PHQ- 9 Score 13 20 13 7 8 9 9     Fall Risk    05/09/2023   12:58 PM 03/16/2023   11:09 AM 05/04/2022   10:06 AM 03/30/2022   11:14 AM 11/23/2017   10:34 AM  Fall Risk   Falls in the past year? 0 0 0 0 No  Number falls in past yr: 0 0 0 0   Injury with Fall? 0 0 0 0   Risk for fall due to : No Fall Risks No Fall Risks No Fall Risks No Fall Risks   Follow up Falls prevention discussed;Education provided;Falls evaluation completed        MEDICARE RISK AT HOME: Medicare Risk at Home Any stairs in or around the home?: No If so, are there any without handrails?: No Home free of loose throw rugs in walkways, pet beds, electrical cords, etc?: Yes Adequate lighting in your home to reduce risk of falls?: Yes Life alert?: No Use of a cane, walker or w/c?: No Grab bars in the bathroom?: No Shower chair or bench in shower?: No Elevated toilet seat or a handicapped toilet?: No  TIMED UP AND GO:  Was the test performed?  No    Cognitive Function:        05/09/2023   12:59 PM 05/04/2022   10:07  AM  6CIT Screen  What Year? 0 points 0 points  What month? 0 points 0 points  What time? 0 points 0 points  Count back from 20 0 points 0 points  Months in reverse 0 points 0 points  Repeat phrase 0 points 0 points  Total Score 0 points 0 points    Immunizations Immunization History  Administered Date(s) Administered   Influenza Split 04/26/2011   Influenza Whole 05/12/2009   Influenza,inj,Quad PF,6+ Mos 04/07/2016, 07/04/2017, 03/30/2022   Tdap 04/26/2011     TDAP status: Due, Education has been provided regarding the importance of this vaccine. Advised may receive this vaccine at local pharmacy or Health Dept. Aware to provide a copy of the vaccination record if obtained from local pharmacy or Health Dept. Verbalized acceptance and understanding.  Flu Vaccine status: Declined, Education has been provided regarding the importance of this vaccine but patient still declined. Advised may receive this vaccine at local pharmacy or Health Dept. Aware to provide a copy of the vaccination record if obtained from local pharmacy or Health Dept. Verbalized acceptance and understanding.  Pneumococcal vaccine status: Up to date  Covid-19 vaccine status: Information provided on how to obtain vaccines.   Qualifies for Shingles Vaccine? Yes   Zostavax completed No   Shingrix Completed?: No.    Education has been provided regarding the importance of this vaccine. Patient has been advised to call insurance company to determine out of pocket expense if they have not yet received this vaccine. Advised may also receive vaccine at local pharmacy or Health Dept. Verbalized acceptance and understanding.  Screening Tests Health Maintenance  Topic Date Due   COVID-19 Vaccine (1) Never done   Zoster Vaccines- Shingrix (1 of 2) Never done   Colonoscopy  Never done   DTaP/Tdap/Td (2 - Td or Tdap) 04/25/2021   Lung Cancer Screening  Never done   INFLUENZA VACCINE  03/02/2023   Medicare Annual Wellness (AWV)  05/08/2024   MAMMOGRAM  07/22/2024   Cervical Cancer Screening (HPV/Pap Cotest)  05/05/2027   Hepatitis C Screening  Completed   HIV Screening  Completed   HPV VACCINES  Aged Out    Health Maintenance  Health Maintenance Due  Topic Date Due   COVID-19 Vaccine (1) Never done   Zoster Vaccines- Shingrix (1 of 2) Never done   Colonoscopy  Never done   DTaP/Tdap/Td (2 - Td or Tdap) 04/25/2021   Lung Cancer Screening  Never done   INFLUENZA VACCINE   03/02/2023    Colorectal cancer screening:  Patient declines at this time   Mammogram status: Completed 07/22/22. Repeat every year  Lung Cancer Screening: (Low Dose CT Chest recommended if Age 73-80 years, 20 pack-year currently smoking OR have quit w/in 15years.) does not qualify.   Lung Cancer Screening Referral: n/a  Additional Screening:  Hepatitis C Screening: does qualify; Completed 03/30/22  Vision Screening: Recommended annual ophthalmology exams for early detection of glaucoma and other disorders of the eye. Is the patient up to date with their annual eye exam?  No  Who is the provider or what is the name of the office in which the patient attends annual eye exams? None  If pt is not established with a provider, would they like to be referred to a provider to establish care? No .   Dental Screening: Recommended annual dental exams for proper oral hygiene  Community Resource Referral / Chronic Care Management: CRR required this visit?  No   CCM required  this visit?  No     Plan:     I have personally reviewed and noted the following in the patient's chart:   Medical and social history Use of alcohol, tobacco or illicit drugs  Current medications and supplements including opioid prescriptions. Patient is not currently taking opioid prescriptions. Functional ability and status Nutritional status Physical activity Advanced directives List of other physicians Hospitalizations, surgeries, and ER visits in previous 12 months Vitals Screenings to include cognitive, depression, and falls Referrals and appointments  In addition, I have reviewed and discussed with patient certain preventive protocols, quality metrics, and best practice recommendations. A written personalized care plan for preventive services as well as general preventive health recommendations were provided to patient.     Kandis Fantasia [place], [place]   [id number]   After Visit Summary: (Mail) Due  to this being a telephonic visit, the after visit summary with patients personalized plan was offered to patient via mail   Nurse Notes: No concerns at this time

## 2023-05-09 NOTE — Patient Instructions (Signed)
Vanessa Wood , Thank you for taking time to come for your Medicare Wellness Visit. I appreciate your ongoing commitment to your health goals. Please review the following plan we discussed and let me know if I can assist you in the future.   Referrals/Orders/Follow-Ups/Clinician Recommendations: Aim for 30 minutes of exercise or brisk walking, 6-8 glasses of water, and 5 servings of fruits and vegetables each day.  This is a list of the screening recommended for you and due dates:  Health Maintenance  Topic Date Due   COVID-19 Vaccine (1) Never done   Zoster (Shingles) Vaccine (1 of 2) Never done   Colon Cancer Screening  Never done   DTaP/Tdap/Td vaccine (2 - Td or Tdap) 04/25/2021   Screening for Lung Cancer  Never done   Flu Shot  03/02/2023   Medicare Annual Wellness Visit  05/08/2024   Mammogram  07/22/2024   Pap with HPV screening  05/05/2027   Hepatitis C Screening  Completed   HIV Screening  Completed   HPV Vaccine  Aged Out    Advanced directives: (ACP Link)Information on Advanced Care Planning can be found at [facility] [place] Advance Health Care Directives Advance Health Care Directives ([email or web address]   Next Medicare Annual Wellness Visit scheduled for next year: Yes

## 2023-06-27 ENCOUNTER — Other Ambulatory Visit: Payer: Self-pay | Admitting: Family Medicine

## 2023-06-27 DIAGNOSIS — K2100 Gastro-esophageal reflux disease with esophagitis, without bleeding: Secondary | ICD-10-CM

## 2023-06-27 DIAGNOSIS — J4520 Mild intermittent asthma, uncomplicated: Secondary | ICD-10-CM

## 2023-09-12 ENCOUNTER — Ambulatory Visit: Payer: Self-pay | Admitting: Family Medicine

## 2023-09-12 NOTE — Telephone Encounter (Signed)
Chief Complaint: panic attack/anxiety  Symptoms: heavy chest/pain  Frequency: comes and goes  Disposition: [] ED /[] Urgent Care (no appt availability in office) / [x] Appointment(In office/virtual)/ []  [place] [facility]/ [] Home Care/ [] Refused Recommended Disposition /[] [place] [facility]/ []  Follow-up with PCP Additional Notes: Pt calling with complaints of heavy chest/ chest pain related to anxiety. Pt has been feeling this way for over a year and is under the care of a psychiatrist. Pt had appt with psychiatrist this morning for monthly Abilty shot.  Pt stated she was told to follow up with PCP on possible irregular heartbeat. No testing was done to evaluate, but pt states she doesn't feel like it is beating irregular.  Pt states she has mild SOB, but stated she has asthma, smokes, and is overweight. Per protocol, pt to be seen within 2 weeks. Pt refused appt today due to "I have to get in a car and I can't do that today. I have to prep myself for that." RN offered virtual appt, but pt declined. Due to ongoing feeling, under psychiatric care this morning,  and after educating, RN comfortable offering later appt. Pt to be seen 2/26 @ 0930. RN gave care advice and pt verbalized understanding.         Copied from CRM [id number]. Topic: Appointments - Appointment Scheduling >> Sep 12, 2023 11:27 AM Patsy Lager T wrote: Patient/patient representative is calling to schedule an appointment. Refer to attachments for appointment information. Psychiatrist advised to contact PCP as she has an irregular heartbeat. Patient is experiencing chest pain Reason for Disposition  [1] Symptoms of anxiety or panic attack AND [2] is a chronic symptom (recurrent or ongoing AND present > 4 weeks)  Answer Assessment - Initial Assessment Questions 1. CONCERN: "Did anything happen that prompted you to call today?"      Psychiatrist advised pt follow up with pcp  2. ANXIETY SYMPTOMS: "Can you describe how you  (your loved one; patient) have been feeling?" (e.g., tense, restless, panicky, anxious, keyed up, overwhelmed, sense of impending doom).      Overwhelmed with money 3. ONSET: "How long have you been feeling this way?" (e.g., hours, days, weeks)     Over a year  4. SEVERITY: "How would you rate the level of anxiety?" (e.g., 0 - 10; or mild, moderate, severe).     8 5. FUNCTIONAL IMPAIRMENT: "How have these feelings affected your ability to do daily activities?" "Have you had more difficulty than usual doing your normal daily activities?" (e.g., getting better, same, worse; self-care, school, work, interactions)     Yes 6. HISTORY: "Have you felt this way before?" "Have you ever been diagnosed with an anxiety problem in the past?" (e.g., generalized anxiety disorder, panic attacks, PTSD). If Yes, ask: "How was this problem treated?" (e.g., medicines, counseling, etc.)     Yes in counseling-gabapentin, Abilify  7. RISK OF HARM - SUICIDAL IDEATION: "Do you ever have thoughts of hurting or killing yourself?" If Yes, ask:  "Do you have these feelings now?" "Do you have a plan on how you would do this?"     no 8. TREATMENT:  "What has been done so far to treat this anxiety?" (e.g., medicines, relaxation strategies). "What has helped?"     Medicine, therapy  9. TREATMENT - THERAPIST: "Do you have a counselor or therapist? Name?"     Yes, psychiatrist  10. POTENTIAL TRIGGERS: "Do you drink caffeinated beverages (e.g., coffee, colas, teas), and how much daily?" "Do you drink  alcohol or use any drugs?" "Have you started any new medicines recently?"       No  11. PATIENT SUPPORT: "Who is with you now?" "Who do you live with?" "Do you have family or friends who you can talk to?"        Boyfriend  12. OTHER SYMPTOMS: "Do you have any other symptoms?" (e.g., feeling depressed, trouble concentrating, trouble sleeping, trouble breathing, palpitations or fast heartbeat, chest pain, sweating, nausea, or diarrhea)        Chest tightness/pain  Protocols used: Anxiety and Panic Attack-A-AH

## 2023-09-27 ENCOUNTER — Encounter: Payer: Self-pay | Admitting: Physician Assistant

## 2023-09-27 ENCOUNTER — Ambulatory Visit: Payer: Medicare Other | Attending: Physician Assistant | Admitting: Physician Assistant

## 2023-09-27 VITALS — BP 124/84 | HR 78 | Temp 97.9°F | Ht 66.0 in | Wt 266.0 lb

## 2023-09-27 DIAGNOSIS — Z1331 Encounter for screening for depression: Secondary | ICD-10-CM | POA: Diagnosis not present

## 2023-09-27 DIAGNOSIS — E785 Hyperlipidemia, unspecified: Secondary | ICD-10-CM | POA: Diagnosis not present

## 2023-09-27 DIAGNOSIS — F410 Panic disorder [episodic paroxysmal anxiety] without agoraphobia: Secondary | ICD-10-CM | POA: Insufficient documentation

## 2023-09-27 DIAGNOSIS — K219 Gastro-esophageal reflux disease without esophagitis: Secondary | ICD-10-CM | POA: Diagnosis not present

## 2023-09-27 DIAGNOSIS — I10 Essential (primary) hypertension: Secondary | ICD-10-CM | POA: Insufficient documentation

## 2023-09-27 DIAGNOSIS — R7303 Prediabetes: Secondary | ICD-10-CM

## 2023-09-27 DIAGNOSIS — K2100 Gastro-esophageal reflux disease with esophagitis, without bleeding: Secondary | ICD-10-CM

## 2023-09-27 DIAGNOSIS — F411 Generalized anxiety disorder: Secondary | ICD-10-CM | POA: Diagnosis not present

## 2023-09-27 DIAGNOSIS — R87612 Low grade squamous intraepithelial lesion on cytologic smear of cervix (LGSIL): Secondary | ICD-10-CM

## 2023-09-27 DIAGNOSIS — Z79899 Other long term (current) drug therapy: Secondary | ICD-10-CM | POA: Insufficient documentation

## 2023-09-27 MED ORDER — OMEPRAZOLE 20 MG PO CPDR: DELAYED_RELEASE_CAPSULE | ORAL | 3 refills | Status: DC

## 2023-09-27 MED ORDER — HYDROXYZINE PAMOATE 25 MG PO CAPS: 25.0000 mg | ORAL_CAPSULE | Freq: Three times a day (TID) | ORAL | 2 refills | Status: AC | PRN

## 2023-09-27 MED ORDER — BUPROPION HCL ER (SR) 150 MG PO TB12: 150.0000 mg | ORAL_TABLET | Freq: Two times a day (BID) | ORAL | 1 refills | Status: DC

## 2023-09-27 NOTE — Patient Instructions (Signed)
 Work at a goal of eliminating sugary drinks, candy, desserts, sweets, refined sugars, processed foods, and white carbohydrates.   Drink 64 to 80 ounces water daily.  Practice deep breathing and nutrition

## 2023-09-27 NOTE — Addendum Note (Signed)
 Addended by: Anders Simmonds on: 09/27/2023 10:29 AM   Modules accepted: Orders

## 2023-09-27 NOTE — Progress Notes (Addendum)
 Patient ID: Vanessa Wood, female   DOB: Mar 27, 1971, 54 y.o.   MRN: [id number]   Vanessa Wood, is a 53 y.o. female  YQM:[id number]  [id number]  DOB - 1971-04-06  Chief Complaint  Patient presents with   Hypertension    BP concerns Panic attack episodes X3-4 mo Requesting Wellbutrin & Omeprazole be sent to new pharm       Subjective:   Vanessa Wood is a 53 y.o. female here today for panic attacks and blood work.  Over the last couple of months she has had more episodes of panic.  About 2-3 times weekly.  She is a patient at [facility] and is on gabapentin, injectable abilify, and wellbutrin.  Dhe denies SI/HI.  She tries to do deep breathing and self-soothe but feels like she needs something on hand incase that does not work.  Admits to poor nutrition and does not drink water.  Has been concerned with BP.    No problems updated.  ALLERGIES: Allergies  Allergen Reactions   Codeine Hives    PAST MEDICAL HISTORY: Past Medical History:  Diagnosis Date   Allergy    Anxiety    Arthritis    Asthma    Bipolar 1 disorder (HCC)    Borderline personality disorder (HCC)    Cataract    Cervical dysphagia    age 15   Diverticulitis    GERD (gastroesophageal reflux disease)    H/O degenerative disc disease    Major depressive disorder    Peptic ulcer disease    PTSD (post-traumatic stress disorder)    Sciatic leg pain    Substance abuse (HCC)     MEDICATIONS AT HOME: Prior to Admission medications   Medication Sig Start Date End Date Taking? Authorizing Provider  albuterol (VENTOLIN HFA) 108 (90 Base) MCG/ACT inhaler INHALE 1 PUFF INTO THE LUNGS EVERY 6 HOURS AS NEEDED FOR WHEEZING OR SHORTNESS OF BREATH 06/27/23  Yes Newlin, Enobong, MD  ARIPiprazole (ABILIFY) 9.75 MG/1.3ML injection Inject into the muscle every 30 (thirty) days.   Yes [provider]  gabapentin (NEURONTIN) 300 MG capsule Take 1 capsule (300 mg total) by mouth 2 (two) times daily. 11/23/17  Yes  Hoy Register, MD  hydrOXYzine (VISTARIL) 25 MG capsule Take 1 capsule (25 mg total) by mouth every 8 (eight) hours as needed. For anxiety 09/27/23  Yes Anders Simmonds, PA-C  lamoTRIgine (LAMICTAL) 25 MG tablet Take 50 mg by mouth daily. 05/27/22  Yes [provider]  Venlafaxine HCl (EFFEXOR PO) Take 1 tablet by mouth daily.   Yes [provider]  buPROPion (WELLBUTRIN SR) 150 MG 12 hr tablet Take 1 tablet (150 mg total) by mouth 2 (two) times daily. 09/27/23   Anders Simmonds, PA-C  chlorhexidine (PERIDEX) 0.12 % solution SMARTSIG:Milliliter(s) By Mouth Twice Daily Patient not taking: Reported on 09/27/2023 04/26/22   [provider]  omeprazole (PRILOSEC) 20 MG capsule TAKE 1 CAPSULE BY MOUTH TWICE DAILY 09/27/23   Michaelpaul Apo, Marzella Schlein, PA-C    ROS: Neg HEENT Neg resp Neg cardiac Neg GI Neg GU Neg MS Neg psych Neg neuro  Objective:   Vitals:   09/27/23 0945 09/27/23 1013  BP: (!) 143/80 124/84  Pulse: 78   Temp: 97.9 F (36.6 C)   TempSrc: Oral   SpO2: 98%   Weight: 266 lb (120.7 kg)   Height: 5\' 6"  (1.676 m)    Exam General appearance : Awake, alert, not in any distress. Speech Clear. Not toxic  looking HEENT: Atraumatic and Normocephalic Neck: Supple, no JVD. No cervical lymphadenopathy.  Chest: Good air entry bilaterally, CTAB.  No rales/rhonchi/wheezing CVS: S1 S2 regular, no murmurs.  Extremities: B/L Lower Ext shows no edema, both legs are warm to touch Neurology: Awake alert, and oriented X 3, CN II-XII intact, Non focal Skin: No Rash  Data Review Lab Results  Component Value Date   HGBA1C 5.7 (H) 03/30/2022    Assessment & Plan   1. Prediabetes I have had a lengthy discussion and provided education about insulin resistance and the intake of too much sugar/refined carbohydrates.  I have advised the patient to work at a goal of eliminating sugary drinks, candy, desserts, sweets, refined sugars, processed foods, and white  carbohydrates.  The patient expresses understanding.  Increase water intake to 64 to 80 ounces daily - Comprehensive metabolic panel - Lipid Panel - CBC with Differential - Hemoglobin A1c  2. Gastroesophageal reflux disease with esophagitis without hemorrhage - omeprazole (PRILOSEC) 20 MG capsule; TAKE 1 CAPSULE BY MOUTH TWICE DAILY  Dispense: 90 capsule; Refill: 3  3. Positive depression screening Followed by [facility] - buPROPion (WELLBUTRIN SR) 150 MG 12 hr tablet; Take 1 tablet (150 mg total) by mouth 2 (two) times daily.  Dispense: 180 tablet; Refill: 1  4. ANXIETY STATE, UNSPECIFIED (Primary) Can try vistaril for panic attacks.  Also discussed self-soothing and self care techniques - buPROPion (WELLBUTRIN SR) 150 MG 12 hr tablet; Take 1 tablet (150 mg total) by mouth 2 (two) times daily.  Dispense: 180 tablet; Refill: 1 - hydrOXYzine (VISTARIL) 25 MG capsule; Take 1 capsule (25 mg total) by mouth every 8 (eight) hours as needed. For anxiety  Dispense: 30 capsule; Refill: 2  5. Dyslipidemia - Comprehensive metabolic panel - Lipid Panel  6.LGSIL pap and was due repeat 06/2023/  referred to gyn.  Patient verbalized understanding    Return in about 4 months (around 01/25/2024) for PCP for chronic conditions-Newlin.  The patient was given clear instructions to go to ER or return to medical center if symptoms don't improve, worsen or new problems develop. The patient verbalized understanding. The patient was told to call to get lab results if they haven't heard anything in the next week.      Georgian Co, PA-C [facility] [place], [place] [phone]   09/27/2023, 10:20 AM

## 2023-09-28 LAB — COMPREHENSIVE METABOLIC PANEL
ALT: 13 [IU]/L (ref 0–32)
AST: 18 [IU]/L (ref 0–40)
Albumin: 4.2 g/dL (ref 3.8–4.9)
Alkaline Phosphatase: 101 [IU]/L (ref 44–121)
BUN/Creatinine Ratio: 13 (ref 9–23)
BUN: 12 mg/dL (ref 6–24)
Bilirubin Total: <0.2 mg/dL (ref 0.0–1.2)
CO2: 24 mmol/L (ref 20–29)
Calcium: 9.4 mg/dL (ref 8.7–10.2)
Chloride: 104 mmol/L (ref 96–106)
Creatinine, Ser: 0.91 mg/dL (ref 0.57–1.00)
Globulin, Total: 2.9 g/dL (ref 1.5–4.5)
Glucose: 93 mg/dL (ref 70–99)
Potassium: 5.1 mmol/L (ref 3.5–5.2)
Sodium: 143 mmol/L (ref 134–144)
Total Protein: 7.1 g/dL (ref 6.0–8.5)
eGFR: 76 mL/min/{1.73_m2} (ref >59)

## 2023-09-28 LAB — CBC WITH DIFFERENTIAL/PLATELET
Basophils Absolute: 0 10*3/uL (ref 0.0–0.2)
Basos: 1 %
EOS (ABSOLUTE): 0.3 10*3/uL (ref 0.0–0.4)
Eos: 3 %
Hematocrit: 44.6 % (ref 34.0–46.6)
Hemoglobin: 14.5 g/dL (ref 11.1–15.9)
Immature Grans (Abs): 0 10*3/uL (ref 0.0–0.1)
Immature Granulocytes: 0 %
Lymphocytes Absolute: 3.2 10*3/uL — ABNORMAL HIGH (ref 0.7–3.1)
Lymphs: 40 %
MCH: 30 pg (ref 26.6–33.0)
MCHC: 32.5 g/dL (ref 31.5–35.7)
MCV: 92 fL (ref 79–97)
Monocytes Absolute: 0.4 10*3/uL (ref 0.1–0.9)
Monocytes: 5 %
Neutrophils Absolute: 4.1 10*3/uL (ref 1.4–7.0)
Neutrophils: 51 %
Platelets: 237 10*3/uL (ref 150–450)
RBC: 4.83 x10E6/uL (ref 3.77–5.28)
RDW: 12.4 % (ref 11.7–15.4)
WBC: 8 10*3/uL (ref 3.4–10.8)

## 2023-09-28 LAB — LIPID PANEL
Chol/HDL Ratio: 3.4 {ratio} (ref 0.0–4.4)
Cholesterol, Total: 200 mg/dL — ABNORMAL HIGH (ref 100–199)
HDL: 59 mg/dL (ref >39)
LDL Chol Calc (NIH): 118 mg/dL — ABNORMAL HIGH (ref 0–99)
Triglycerides: 133 mg/dL (ref 0–149)
VLDL Cholesterol Cal: 23 mg/dL (ref 5–40)

## 2023-09-28 LAB — HEMOGLOBIN A1C
Est. average glucose Bld gHb Est-mCnc: 117 mg/dL
Hgb A1c MFr Bld: 5.7 % — ABNORMAL HIGH (ref 4.8–5.6)

## 2023-09-29 ENCOUNTER — Telehealth: Payer: Self-pay

## 2023-09-29 NOTE — Telephone Encounter (Signed)
 Pt was called and is aware of results, DOB was confirmed.  ?

## 2023-09-29 NOTE — Telephone Encounter (Signed)
-----   Message from Georgian Co sent at 09/28/2023  8:51 AM EST ----- Please call patient.  Your "bad" cholesterol is elevated and total cholesterol is on the high end of normal.  Prediabetes is stable.  Work on healthy low fat, low sugar diet and both should improve.  Kidneys, liver, electrolyte levels are normal.  Thanks, Georgian Co, PA-C

## 2023-10-02 ENCOUNTER — Other Ambulatory Visit: Payer: Self-pay | Admitting: Family Medicine

## 2023-10-02 NOTE — Telephone Encounter (Signed)
 Copied from CRM [id number]. Topic: Clinical - Medication Refill >> Oct 02, 2023 12:55 PM Carlatta H wrote: Most Recent Primary Care Visit:  Provider: Anders Simmonds  Department: [facility]  Visit Type: ACUTE  Date: 09/27/2023  Medication: Flonase  Has the patient contacted their pharmacy? No (Agent: If no, request that the patient contact the pharmacy for the refill. If patient does not wish to contact the pharmacy document the reason why and proceed with request.) (Agent: If yes, when and what did the pharmacy advise?)  Is this the correct pharmacy for this prescription? Yes If no, delete pharmacy and type the correct one.  This is the patient's preferred pharmacy:    [id number] [place], [place] - [address] [place] [address] Phone: [phone] Fax: [phone]   Has the prescription been filled recently? No  Is the patient out of the medication? Yes  Has the patient been seen for an appointment in the last year OR does the patient have an upcoming appointment? No  Can we respond through MyChart? No  Agent: Please be advised that Rx refills may take up to 3 business days. We ask that you follow-up with your pharmacy.

## 2023-10-03 MED ORDER — FLUTICASONE PROPIONATE 50 MCG/ACT NA SUSP: 1.0000 | Freq: Every day | NASAL | 1 refills | Status: DC

## 2023-10-03 NOTE — Telephone Encounter (Signed)
 Last Fill: Unknown  Last OV: 09/27/23 Next OV: 05/14/24 AWV  Routing to provider for review/authorization.

## 2023-10-09 ENCOUNTER — Telehealth: Payer: Self-pay

## 2023-10-09 MED ORDER — FLUTICASONE PROPIONATE 50 MCG/ACT NA SUSP: 1.0000 | Freq: Every day | NASAL | 1 refills | Status: DC

## 2023-10-09 NOTE — Telephone Encounter (Signed)
 Patient was called and [facility]    Copied from CRM [id number]. Topic: Clinical - Prescription Issue >> Oct 09, 2023  4:18 PM Vanessa Wood wrote: Reason for CRM: pt is requesting a medication of Flonase since 03/03, has not received anything and would like follow up.    Pt callback [phone]

## 2023-11-08 ENCOUNTER — Ambulatory Visit: Admitting: Obstetrics and Gynecology

## 2023-11-08 ENCOUNTER — Encounter: Payer: Self-pay | Admitting: Obstetrics and Gynecology

## 2023-11-08 VITALS — BP 102/68 | HR 82 | Temp 97.8°F | Ht 65.0 in | Wt 263.0 lb

## 2023-11-08 DIAGNOSIS — R87612 Low grade squamous intraepithelial lesion on cytologic smear of cervix (LGSIL): Secondary | ICD-10-CM

## 2023-11-08 DIAGNOSIS — Z9189 Other specified personal risk factors, not elsewhere classified: Secondary | ICD-10-CM | POA: Diagnosis not present

## 2023-11-08 DIAGNOSIS — Z1331 Encounter for screening for depression: Secondary | ICD-10-CM | POA: Diagnosis not present

## 2023-11-08 DIAGNOSIS — Z8619 Personal history of other infectious and parasitic diseases: Secondary | ICD-10-CM | POA: Diagnosis not present

## 2023-11-08 DIAGNOSIS — R350 Frequency of micturition: Secondary | ICD-10-CM | POA: Diagnosis not present

## 2023-11-08 DIAGNOSIS — Z01419 Encounter for gynecological examination (general) (routine) without abnormal findings: Secondary | ICD-10-CM | POA: Insufficient documentation

## 2023-11-08 NOTE — Assessment & Plan Note (Signed)
 2018 NIL, HPV neg 2023 LSIL, HPV neg per ASCCP repeat in 3 years, due 2026

## 2023-11-08 NOTE — Assessment & Plan Note (Signed)
 Cervical cancer screening performed according to ASCCP guidelines. Encouraged annual mammogram screening Colonoscopy due DXA N/A Labs and immunizations with her primary Encouraged safe sexual practices as indicated Encouraged healthy lifestyle practices with diet and exercise For patients under 50-52yo, I recommend 1200mg  calcium daily and 600IU of vitamin D daily.

## 2023-11-08 NOTE — Progress Notes (Signed)
 53 y.o. G75P2001 female with LSIL Pap (2023), tobacco use here for annual exam-high risk Medicare. Single.  Lives alone with her 2 dogs.  Has a 39 year old daughter.  No LMP recorded (lmp unknown). Patient is postmenopausal.  Menses stopped at 53 years old.   Today she reports some leakage with coughing.  Voids approximately every 3 hours during the day and once at night.  No dysuria.  Abnormal bleeding: None Pelvic discharge or pain: None Breast mass, nipple discharge or skin changes : None  Last PAP:     Component Value Date/Time   DIAGPAP - Low grade squamous intraepithelial lesion (LSIL) (A) 05/04/2022 1025   DIAGPAP  01/30/2017 0000    NEGATIVE FOR INTRAEPITHELIAL LESIONS OR MALIGNANCY.   DIAGPAP TRICHOMONAS VAGINALIS PRESENT. 01/30/2017 0000   HPVHIGH Negative 05/04/2022 1025   ADEQPAP  05/04/2022 1025    Satisfactory for evaluation; transformation zone component PRESENT.   ADEQPAP  01/30/2017 0000    Satisfactory for evaluation  endocervical/transformation zone component PRESENT.   Last mammogram: 07/22/2022 BI-RADS 1, density a Last colonoscopy: Never, referred by PCP Sexually active: No Exercising: Yes. Walk everyday, walking dogs Smoker: yes  Flowsheet Row Office Visit from 11/08/2023 in [facility]  PHQ-2 Total Score 4       GYN HISTORY: No significant history  OB History  Gravida Para Term Preterm AB Living  2 2 2   1   SAB IAB Ectopic Multiple Live Births      2    # Outcome Date GA Lbr Len/2nd Weight Sex Type Anes PTL Lv  2 Term 08/21/96    F Vag-Spont   LIV  1 Term 11/17/92    F Vag-Spont   DEC     Birth Comments: passed away in 2022--OD    Past Medical History:  Diagnosis Date   Allergy    Anxiety    Arthritis    Asthma    Bipolar 1 disorder (HCC)    Borderline personality disorder (HCC)    Cataract    Cervical dysphagia    age 53   Diverticulitis    GERD (gastroesophageal reflux disease)    H/O degenerative  disc disease    Major depressive disorder    Peptic ulcer disease    PTSD (post-traumatic stress disorder)    Sciatic leg pain    Substance abuse (HCC)     Past Surgical History:  Procedure Laterality Date   cervical dysphagia cell removed     age 53   RETINAL DETACHMENT SURGERY  1985   UPPER GASTROINTESTINAL ENDOSCOPY      Current Outpatient Medications on File Prior to Visit  Medication Sig Dispense Refill   albuterol (VENTOLIN HFA) 108 (90 Base) MCG/ACT inhaler INHALE 1 PUFF INTO THE LUNGS EVERY 6 HOURS AS NEEDED FOR WHEEZING OR SHORTNESS OF BREATH 18 g 3   ARIPiprazole (ABILIFY) 9.75 MG/1.3ML injection Inject into the muscle every 30 (thirty) days.     buPROPion (WELLBUTRIN SR) 150 MG 12 hr tablet Take 1 tablet (150 mg total) by mouth 2 (two) times daily. 180 tablet 1   gabapentin (NEURONTIN) 300 MG capsule Take 1 capsule (300 mg total) by mouth 2 (two) times daily. 60 capsule 3   hydrOXYzine (VISTARIL) 25 MG capsule Take 1 capsule (25 mg total) by mouth every 8 (eight) hours as needed. For anxiety 30 capsule 2   lamoTRIgine (LAMICTAL) 25 MG tablet Take 50 mg by mouth daily.  omeprazole (PRILOSEC) 20 MG capsule TAKE 1 CAPSULE BY MOUTH TWICE DAILY 90 capsule 3   No current facility-administered medications on file prior to visit.    Social History   Socioeconomic History   Marital status: Single    Spouse name: Not on file   Number of children: 2   Years of education: Not on file   Highest education level: Not on file  Occupational History   Occupation: disabled  Tobacco Use   Smoking status: Every Day    Current packs/day: 1.00    Average packs/day: 1 pack/day for 31.0 years (31.0 ttl pk-yrs)    Types: Cigarettes   Smokeless tobacco: Never   Tobacco comments:    decrease smoking to 0.5 ppd  Vaping Use   Vaping status: Never Used  Substance and Sexual Activity   Alcohol use: Not Currently    Comment: quit years ago   Drug use: Yes    Types: Marijuana     Comment: no cocaine in last years   Sexual activity: Not Currently    Birth control/protection: None    Comment: Medicare-5+ partners, Hx Trich.IC >16 High Risk  Other Topics Concern   Not on file  Social History Narrative   Not on file   Social Drivers of Health   Financial Resource Strain: Low Risk  (05/09/2023)   Overall Financial Resource Strain (CARDIA)    Difficulty of Paying Living Expenses: Not very hard  Food Insecurity: No Food Insecurity (05/09/2023)   Hunger Vital Sign    Worried About Running Out of Food in the Last Year: Never true    Ran Out of Food in the Last Year: Never true  Transportation Needs: No Transportation Needs (05/09/2023)   PRAPARE - Administrator, Civil Service (Medical): No    Lack of Transportation (Non-Medical): No  Physical Activity: Inactive (05/09/2023)   Exercise Vital Sign    Days of Exercise per Week: 0 days    Minutes of Exercise per Session: 0 min  Stress: No Stress Concern Present (05/09/2023)   [employer] of Occupational Health - Occupational Stress Questionnaire    Feeling of Stress : Only a little  Social Connections: Socially Isolated (05/09/2023)   Social Connection and Isolation Panel [NHANES]    Frequency of Communication with Friends and Family: More than three times a week    Frequency of Social Gatherings with Friends and Family: Twice a week    Attends Religious Services: Never    Database administrator or Organizations: No    Attends Banker Meetings: Never    Marital Status: Never married  Intimate Partner Violence: Not At Risk (05/09/2023)   Humiliation, Afraid, Rape, and Kick questionnaire    Fear of Current or Ex-Partner: No    Emotionally Abused: No    Physically Abused: No    Sexually Abused: No    Family History  Problem Relation Age of Onset   Emphysema Maternal Grandmother    Lung cancer Maternal Grandmother        smoker   Pancreatic cancer Maternal Grandmother    Pancreatic  cancer Maternal Grandfather    Throat cancer Maternal Grandfather        smoker   Colon polyps Maternal Grandfather    Other Mother        borderline diabetic   GER disease Mother    Fibromyalgia Mother    Osteoporosis Mother    COPD Mother    Depression Mother  Colon polyps Mother        benign   Diverticulosis Mother    Suicidality Father    Other Daughter        accidently overdose   Colon cancer Neg Hx    Rectal cancer Neg Hx    Esophageal cancer Neg Hx    Stomach cancer Neg Hx     Allergies  Allergen Reactions   Codeine Hives      PE Today's Vitals   11/08/23 1103  BP: 102/68  Pulse: 82  Temp: 97.8 F (36.6 C)  TempSrc: Oral  SpO2: 94%  Weight: 263 lb (119.3 kg)  Height: 5\' 5"  (1.651 m)   Body mass index is 43.77 kg/m.  Physical Exam Vitals reviewed. Exam conducted with a chaperone present.  Constitutional:      General: She is not in acute distress.    Appearance: Normal appearance.  HENT:     Head: Normocephalic and atraumatic.     Nose: Nose normal.  Eyes:     Extraocular Movements: Extraocular movements intact.     Conjunctiva/sclera: Conjunctivae normal.  Neck:     Thyroid: No thyroid mass, thyromegaly or thyroid tenderness.  Pulmonary:     Effort: Pulmonary effort is normal.  Chest:     Chest wall: No mass or tenderness.  Breasts:    Right: Normal. No swelling, mass, nipple discharge, skin change or tenderness.     Left: Normal. No swelling, mass, nipple discharge, skin change or tenderness.  Abdominal:     General: There is no distension.     Palpations: Abdomen is soft.     Tenderness: There is no abdominal tenderness.  Genitourinary:    General: Normal vulva.     Exam position: Lithotomy position.     Urethra: No prolapse.     Vagina: Normal. No vaginal discharge or bleeding.     Cervix: Normal. No lesion.     Uterus: Normal. Not enlarged and not tender.      Adnexa: Right adnexa normal and left adnexa normal.   Musculoskeletal:        General: Normal range of motion.     Cervical back: Normal range of motion.  Lymphadenopathy:     Upper Body:     Right upper body: No axillary adenopathy.     Left upper body: No axillary adenopathy.     Lower Body: No right inguinal adenopathy. No left inguinal adenopathy.  Skin:    General: Skin is warm and dry.  Neurological:     General: No focal deficit present.     Mental Status: She is alert.  Psychiatric:        Mood and Affect: Mood normal.        Behavior: Behavior normal.      Assessment and Plan:        Well woman exam with routine gynecological exam Assessment & Plan: Cervical cancer screening performed according to ASCCP guidelines. Encouraged annual mammogram screening Colonoscopy due DXA N/A Labs and immunizations with her primary Encouraged safe sexual practices as indicated Encouraged healthy lifestyle practices with diet and exercise For patients under 50-70yo, I recommend 1200mg  calcium daily and 600IU of vitamin D daily.    Other specified personal risk factors, not elsewhere classified  LGSIL on Pap smear of cervix Assessment & Plan: 2018 NIL, HPV neg 2023 LSIL, HPV neg per ASCCP repeat in 3 years, due 2026   Urinary frequency -     Urinalysis,Complete w/RFL Culture  Rosalyn Gess, MD

## 2023-11-08 NOTE — Patient Instructions (Signed)

## 2023-11-09 LAB — URINALYSIS, COMPLETE W/RFL CULTURE
Bilirubin Urine: NEGATIVE
Glucose, UA: NEGATIVE
Hyaline Cast: NONE SEEN /LPF
Ketones, ur: NEGATIVE
Nitrites, Initial: NEGATIVE
Protein, ur: NEGATIVE
Specific Gravity, Urine: 1.02 (ref 1.001–1.035)
pH: 5.5 (ref 5.0–8.0)

## 2023-11-09 LAB — URINE CULTURE
MICRO NUMBER:: 16308381
SPECIMEN QUALITY:: ADEQUATE

## 2023-11-09 LAB — CULTURE INDICATED

## 2023-11-21 ENCOUNTER — Telehealth: Payer: Self-pay

## 2023-11-21 NOTE — Telephone Encounter (Signed)
 Patient called & left message on triage voicemail asking about her pap result. I notified patient that her pap in 2018 was normal & her pap in 2023 was LGSIL HPV HR neg. Per Dr Andrena Ke annual exam note, no pap was done. Next pap will be done in 2026. Patient is okay with recommendations.

## 2024-01-24 ENCOUNTER — Other Ambulatory Visit: Payer: Self-pay | Admitting: Family Medicine

## 2024-01-28 ENCOUNTER — Other Ambulatory Visit: Payer: Self-pay | Admitting: Physician Assistant

## 2024-01-28 DIAGNOSIS — K2100 Gastro-esophageal reflux disease with esophagitis, without bleeding: Secondary | ICD-10-CM

## 2024-02-13 ENCOUNTER — Telehealth: Payer: Self-pay | Admitting: Family Medicine

## 2024-02-13 NOTE — Telephone Encounter (Signed)
 Please reach out to patient and schedule a visit with Dr.Newlin

## 2024-02-13 NOTE — Telephone Encounter (Signed)
 Copied from CRM [id number]. Topic: Referral - Request for Referral >> Feb 13, 2024 12:13 PM Myrick T wrote:  Did the patient discuss referral with their provider in the last year? Yes  Appointment offered? No  Type of order/referral and detailed reason for visit: Psychiatrist  Preference of office, provider, location: [facility] phn# [phone] and fax# [phone]  If referral order, have you been seen by this specialty before? Yes (If Yes, this issue or another issue? When? Where? [facility] and she see Prentice but she does not feel as if he listens to her  Can we respond through MyChart? No

## 2024-02-20 ENCOUNTER — Other Ambulatory Visit: Payer: Self-pay | Admitting: Family Medicine

## 2024-02-21 ENCOUNTER — Other Ambulatory Visit: Payer: Self-pay | Admitting: Family Medicine

## 2024-02-21 DIAGNOSIS — K2100 Gastro-esophageal reflux disease with esophagitis, without bleeding: Secondary | ICD-10-CM

## 2024-02-28 ENCOUNTER — Ambulatory Visit: Admitting: Family Medicine

## 2024-03-17 ENCOUNTER — Other Ambulatory Visit: Payer: Self-pay | Admitting: Physician Assistant

## 2024-03-17 DIAGNOSIS — Z1331 Encounter for screening for depression: Secondary | ICD-10-CM

## 2024-03-17 DIAGNOSIS — F411 Generalized anxiety disorder: Secondary | ICD-10-CM

## 2024-05-14 ENCOUNTER — Ambulatory Visit: Payer: Medicare Other

## 2024-08-15 ENCOUNTER — Other Ambulatory Visit: Payer: Self-pay | Admitting: Family Medicine

## 2024-08-27 ENCOUNTER — Ambulatory Visit: Attending: Family Medicine

## 2024-08-27 ENCOUNTER — Telehealth: Payer: Self-pay | Admitting: Family Medicine

## 2024-08-27 VITALS — Ht 66.0 in | Wt 267.0 lb

## 2024-08-27 DIAGNOSIS — Z0000 Encounter for general adult medical examination without abnormal findings: Secondary | ICD-10-CM | POA: Diagnosis not present

## 2024-08-27 NOTE — Telephone Encounter (Signed)
 Copied from CRM [id number]. Topic: General - Other >> Aug 27, 2024 11:40 AM   Antony RAMAN wrote:  Reason for CRM: wanted to let the nurse know who did her awv that her blood pressure has been higher than normal, 142/91 and 139/102 over different days, she forgot to mention it. Did not want to make an appt at this time

## 2024-08-27 NOTE — Telephone Encounter (Signed)
 Noted patient declined appointment at this time.

## 2024-08-27 NOTE — Patient Instructions (Signed)
 Ms. Baldonado,  Thank you for taking the time for your Medicare Wellness Visit. I appreciate your continued commitment to your health goals. Please review the care plan we discussed, and feel free to reach out if I can assist you further.  Please note that Annual Wellness Visits do not include a physical exam. Some assessments may be limited, especially if the visit was conducted virtually. If needed, we may recommend an in-person follow-up with your provider.  Ongoing Care Seeing your primary care provider every 3 to 6 months helps us  monitor your health and provide consistent, personalized care.   Referrals If a referral was made during today's visit and you haven't received any updates within two weeks, please contact the referred provider directly to check on the status.  Recommended Screenings:  Health Maintenance  Topic Date Due   COVID-19 Vaccine (1) Never done   Pneumococcal Vaccine for age over 81 (1 of 2 - PCV) Never done   Hepatitis B Vaccine (1 of 3 - 19+ 3-dose series) Never done   Zoster (Shingles) Vaccine (1 of 2) Never done   Colon Cancer Screening  Never done   DTaP/Tdap/Td vaccine (2 - Td or Tdap) 04/25/2021   Screening for Lung Cancer  Never done   Flu Shot  03/01/2024   Medicare Annual Wellness Visit  05/08/2024   Breast Cancer Screening  07/22/2024   Pap with HPV screening  05/05/2027   HPV Vaccine (No Doses Required) Completed   Hepatitis C Screening  Completed   HIV Screening  Completed   Meningitis B Vaccine  Aged Out       08/27/2024   11:15 AM  Advanced Directives  Does Patient Have a Medical Advance Directive? No  Would patient like information on creating a medical advance directive? No - Patient declined    Vision: Annual vision screenings are recommended for early detection of glaucoma, cataracts, and diabetic retinopathy. These exams can also reveal signs of chronic conditions such as diabetes and high blood pressure.  Dental: Annual dental  screenings help detect early signs of oral cancer, gum disease, and other conditions linked to overall health, including heart disease and diabetes.  Please see the attached documents for additional preventive care recommendations.

## 2024-08-27 NOTE — Telephone Encounter (Signed)
 She needs an in person office visit with any available clinician or the mobile unit.

## 2024-08-27 NOTE — Telephone Encounter (Signed)
 FYI

## 2024-08-27 NOTE — Progress Notes (Signed)
 "  Chief Complaint  Patient presents with   Medicare Wellness    SUBSEQUENT     Subjective:   Vanessa Wood is a 54 y.o. female who presents for a Medicare Annual Wellness Visit.  Visit info / Clinical Intake: Medicare Wellness Visit Type:: Subsequent Annual Wellness Visit Persons participating in visit and providing information:: patient Medicare Wellness Visit Mode:: Telephone If telephone:: video declined Since this visit was completed virtually, some vitals may be partially provided or unavailable. Missing vitals are due to the limitations of the virtual format.: Documented vitals are patient reported If Telephone or Video please confirm:: I connected with patient using audio/video enable telemedicine. I verified patient identity with two identifiers, discussed telehealth limitations, and patient agreed to proceed. Patient Location:: HOME Provider Location:: HOME OFFICE Interpreter Needed?: No Pre-visit prep was completed: yes AWV questionnaire completed by patient prior to visit?: no Living arrangements:: (!) lives alone Patient's Overall Health Status Rating: (!) fair Typical amount of pain: (!) a lot Does pain affect daily life?: (!) yes Are you currently prescribed opioids?: no  Dietary Habits and Nutritional Risks How many meals a day?: 3 Eats fruit and vegetables daily?: yes Most meals are obtained by: preparing own meals In the last 2 weeks, have you had any of the following?: none Diabetic:: no  Functional Status Activities of Daily Living (to include ambulation/medication): Independent Ambulation: Independent Medication Administration: Independent Home Management (perform basic housework or laundry): Independent Manage your own finances?: yes Primary transportation is: family / friends (MOTHER) Concerns about vision?: (!) yes Concerns about hearing?: no  Fall Screening Falls in the past year?: 0 Number of falls in past year: 0 Was there an injury with  Fall?: 0 Fall Risk Category Calculator: 0 Patient Fall Risk Level: Low Fall Risk  Fall Risk Patient at Risk for Falls Due to: No Fall Risks Fall risk Follow up: Falls evaluation completed; Education provided  Home and Transportation Safety: All rugs have non-skid backing?: yes All stairs or steps have railings?: N/A, no stairs (FRONT ENTRY ONLY) Grab bars in the bathtub or shower?: (!) no Have non-skid surface in bathtub or shower?: (!) no Good home lighting?: yes Regular seat belt use?: yes Hospital stays in the last year:: no  Cognitive Assessment Difficulty concentrating, remembering, or making decisions? : yes Will 6CIT or Mini Cog be Completed: yes What year is it?: 0 points (2026) What month is it?: 0 points LILI) Give patient an address phrase to remember (5 components): [address] [place] About what time is it?: 0 points Count backwards from 20 to 1: 0 points Say the months of the year in reverse: 0 points Repeat the address phrase from earlier: 2 points 6 CIT Score: 2 points  Advance Directives (For Healthcare) Does Patient Have a Medical Advance Directive?: No Would patient like information on creating a medical advance directive?: No - Patient declined  Reviewed/Updated  Reviewed/Updated: Reviewed All (Medical, Surgical, Family, Medications, Allergies, Care Teams, Patient Goals)    Allergies (verified) Codeine   Current Medications (verified) Outpatient Encounter Medications as of 08/27/2024  Medication Sig   albuterol  (VENTOLIN  HFA) 108 (90 Base) MCG/ACT inhaler INHALE 1 PUFF INTO THE LUNGS EVERY 6 HOURS AS NEEDED FOR WHEEZING OR SHORTNESS OF BREATH   ARIPiprazole (ABILIFY) 9.75 MG/1.3ML injection Inject into the muscle every 30 (thirty) days.   buPROPion  (WELLBUTRIN  SR) 150 MG 12 hr tablet TAKE 1 TABLET BY MOUTH TWICE A DAY   fluticasone  (FLONASE ) 50 MCG/ACT nasal  spray INSTILL 1 SPRAY INTO BOTH NOSTRILS DAILY   gabapentin  (NEURONTIN ) 300 MG  capsule Take 1 capsule (300 mg total) by mouth 2 (two) times daily.   hydrOXYzine  (VISTARIL ) 25 MG capsule Take 1 capsule (25 mg total) by mouth every 8 (eight) hours as needed. For anxiety   lamoTRIgine (LAMICTAL) 25 MG tablet Take 50 mg by mouth daily.   omeprazole  (PRILOSEC) 20 MG capsule TAKE 1 CAPSULE (20 MG TOTAL) BY MOUTH 2 (TWO) TIMES DAILY. MUST HAVE OFFICE VISIT FOR REFILLS   No facility-administered encounter medications on file as of 08/27/2024.    History: Past Medical History:  Diagnosis Date   Allergy    Anxiety    Arthritis    Asthma    Bipolar 1 disorder (HCC)    Borderline personality disorder (HCC)    Cataract    Cervical dysphagia    age 49   Diverticulitis    GERD (gastroesophageal reflux disease)    H/O degenerative disc disease    Major depressive disorder    Peptic ulcer disease    PTSD (post-traumatic stress disorder)    Sciatic leg pain    Substance abuse (HCC)    Past Surgical History:  Procedure Laterality Date   cervical dysphagia cell removed     age 41   RETINAL DETACHMENT SURGERY  1985   UPPER GASTROINTESTINAL ENDOSCOPY     Family History  Problem Relation Age of Onset   Emphysema Maternal Grandmother    Lung cancer Maternal Grandmother        smoker   Pancreatic cancer Maternal Grandmother    Pancreatic cancer Maternal Grandfather    Throat cancer Maternal Grandfather        smoker   Colon polyps Maternal Grandfather    Other Mother        borderline diabetic   GER disease Mother    Fibromyalgia Mother    Osteoporosis Mother    COPD Mother    Depression Mother    Colon polyps Mother        benign   Diverticulosis Mother    Suicidality Father    Other Daughter        accidently overdose   Colon cancer Neg Hx    Rectal cancer Neg Hx    Esophageal cancer Neg Hx    Stomach cancer Neg Hx    Social History   Occupational History   Occupation: disabled  Tobacco Use   Smoking status: Every Day    Current packs/day: 1.00     Average packs/day: 1 pack/day for 31.0 years (31.0 ttl pk-yrs)    Types: Cigarettes   Smokeless tobacco: Never   Tobacco comments:    decrease smoking to 0.5 ppd  Vaping Use   Vaping status: Never Used  Substance and Sexual Activity   Alcohol use: Not Currently    Comment: quit years ago   Drug use: Yes    Types: Marijuana    Comment: no cocaine in last years   Sexual activity: Not Currently    Birth control/protection: None    Comment: Medicare-5+ partners, Hx Trich.IC >16 High Risk   Tobacco Counseling Ready to quit: Not Answered Counseling given: Not Answered Tobacco comments: decrease smoking to 0.5 ppd  SDOH Screenings   Food Insecurity: No Food Insecurity (08/27/2024)  Housing: Low Risk (08/27/2024)  Transportation Needs: No Transportation Needs (08/27/2024)  Utilities: At Risk (08/27/2024)  Alcohol Screen: Low Risk (08/27/2024)  Depression (PHQ2-9): Medium Risk (08/27/2024)  Financial Resource  Strain: Medium Risk (08/27/2024)  Physical Activity: Sufficiently Active (08/27/2024)  Social Connections: Socially Isolated (08/27/2024)  Stress: Stress Concern Present (08/27/2024)  Tobacco Use: High Risk (08/27/2024)  Health Literacy: Adequate Health Literacy (08/27/2024)   See flowsheets for full screening details  Depression Screen PHQ 2 & 9 Depression Scale- Over the past 2 weeks, how often have you been bothered by any of the following problems? Little interest or pleasure in doing things: 0 Feeling down, depressed, or hopeless (PHQ Adolescent also includes...irritable): 3 PHQ-2 Total Score: 3 Trouble falling or staying asleep, or sleeping too much: 2 (SLEEPS ON & OFF; JUST DEPENDS ON THE DAY) Feeling tired or having little energy: 3 Poor appetite or overeating (PHQ Adolescent also includes...weight loss): 0 Feeling bad about yourself - or that you are a failure or have let yourself or your family down: 1 Trouble concentrating on things, such as reading the newspaper or  watching television (PHQ Adolescent also includes...like school work): 1 Moving or speaking so slowly that other people could have noticed. Or the opposite - being so fidgety or restless that you have been moving around a lot more than usual: 0 Thoughts that you would be better off dead, or of hurting yourself in some way: 0 PHQ-9 Total Score: 10 If you checked off any problems, how difficult have these problems made it for you to do your work, take care of things at home, or get along with other people?: Somewhat difficult  Depression Treatment Depression Interventions/Treatment : EYV7-0 Score <4 Follow-up Not Indicated; Currently on Treatment; Medication     Goals Addressed             This Visit's Progress    08/27/2024: Patient stated no goals at this time.       Nurse advised patient to continue to maintain her health, eating 2-3 meals per day, staying hydrated, exercising the brain by doing crossword puzzles, reading, playing games on cellphone and walking around in her yard for exercise.      Exercise 150 min/wk Moderate Activity               Objective:    Today's Vitals   08/27/24 1112  Weight: 267 lb (121.1 kg)  Height: 5' 6 (1.676 m)  PainSc: 4   PainLoc: Back   Body mass index is 43.09 kg/m.  Hearing/Vision screen Hearing Screening - Comments:: Adequate hearing. Vision Screening - Comments:: Blind in right eye. Immunizations and Health Maintenance Health Maintenance  Topic Date Due   COVID-19 Vaccine (1) Never done   Pneumococcal Vaccine: 50+ Years (1 of 2 - PCV) Never done   Hepatitis B Vaccines 19-59 Average Risk (1 of 3 - 19+ 3-dose series) Never done   Zoster Vaccines- Shingrix (1 of 2) Never done   Colonoscopy  Never done   DTaP/Tdap/Td (2 - Td or Tdap) 04/25/2021   Lung Cancer Screening  Never done   Influenza Vaccine  03/01/2024   Mammogram  07/22/2024   Medicare Annual Wellness (AWV)  08/27/2025   Cervical Cancer Screening (HPV/Pap Cotest)   05/05/2027   HPV VACCINES (No Doses Required) Completed   Hepatitis C Screening  Completed   HIV Screening  Completed   Meningococcal B Vaccine  Aged Out        Assessment/Plan:  This is a routine wellness examination for [place].  Patient Care Team: Delbert Clam, MD as PCP - General (Family Medicine) Merrily Pa, Vera GAILS, MD as Consulting Physician (Obstetrics and Gynecology) Jesus,  Ida, MD as Consulting Physician (Otolaryngology)  I have personally reviewed and noted the following in the patients chart:   Medical and social history Use of alcohol, tobacco or illicit drugs  Current medications and supplements including opioid prescriptions. Functional ability and status Nutritional status Physical activity Advanced directives List of other physicians Hospitalizations, surgeries, and ER visits in previous 12 months Vitals Screenings to include cognitive, depression, and falls Referrals and appointments  No orders of the defined types were placed in this encounter.  In addition, I have reviewed and discussed with patient certain preventive protocols, quality metrics, and best practice recommendations. A written personalized care plan for preventive services as well as general preventive health recommendations were provided to patient.   Roz LOISE Fuller, LPN   8/72/7973   Return in about 1 year (around 08/27/2025) for Medicare wellness.  After Visit Summary: (Declined) Due to this being a telephonic visit, with patients personalized plan was offered to patient but patient Declined AVS at this time   Nurse Notes:  HM Addressed: Vaccines Due: Dtap, Flu, Pneumonia, Shingrix and Hepatitis B Patient declined screening procedures  at this time.  "

## 2024-08-30 ENCOUNTER — Other Ambulatory Visit: Payer: Self-pay | Admitting: Family Medicine

## 2024-08-30 DIAGNOSIS — K2100 Gastro-esophageal reflux disease with esophagitis, without bleeding: Secondary | ICD-10-CM

## 2024-09-17 ENCOUNTER — Ambulatory Visit: Payer: Self-pay | Admitting: Internal Medicine
